# Patient Record
Sex: Female | Born: 1945 | Race: White | Hispanic: No | Marital: Married | Smoking: Former smoker
Health system: Southern US, Community
[De-identification: ages and names within clinical notes are randomized; demographics above are authoritative.]

## PROBLEM LIST (undated history)

## (undated) DIAGNOSIS — Q438 Other specified congenital malformations of intestine: Secondary | ICD-10-CM

## (undated) DIAGNOSIS — Z9109 Other allergy status, other than to drugs and biological substances: Secondary | ICD-10-CM

## (undated) DIAGNOSIS — E785 Hyperlipidemia, unspecified: Secondary | ICD-10-CM

## (undated) DIAGNOSIS — K759 Inflammatory liver disease, unspecified: Secondary | ICD-10-CM

## (undated) DIAGNOSIS — R030 Elevated blood-pressure reading, without diagnosis of hypertension: Secondary | ICD-10-CM

## (undated) DIAGNOSIS — F329 Major depressive disorder, single episode, unspecified: Secondary | ICD-10-CM

## (undated) DIAGNOSIS — F32A Depression, unspecified: Secondary | ICD-10-CM

## (undated) DIAGNOSIS — Z8619 Personal history of other infectious and parasitic diseases: Secondary | ICD-10-CM

## (undated) DIAGNOSIS — D649 Anemia, unspecified: Secondary | ICD-10-CM

## (undated) DIAGNOSIS — K5790 Diverticulosis of intestine, part unspecified, without perforation or abscess without bleeding: Secondary | ICD-10-CM

## (undated) DIAGNOSIS — K2270 Barrett's esophagus without dysplasia: Secondary | ICD-10-CM

## (undated) HISTORY — DX: Elevated blood-pressure reading, without diagnosis of hypertension: R03.0

## (undated) HISTORY — PX: VESICOVAGINAL FISTULA CLOSURE W/ TAH: SUR271

## (undated) HISTORY — DX: Barrett's esophagus without dysplasia: K22.70

## (undated) HISTORY — PX: TONSILLECTOMY: SUR1361

## (undated) HISTORY — DX: Other allergy status, other than to drugs and biological substances: Z91.09

## (undated) HISTORY — DX: Hyperlipidemia, unspecified: E78.5

## (undated) HISTORY — DX: Anemia, unspecified: D64.9

## (undated) HISTORY — PX: DILATION AND CURETTAGE OF UTERUS: SHX78

---

## 1999-11-16 HISTORY — PX: ABDOMINAL HYSTERECTOMY: SHX81

## 2004-11-15 HISTORY — PX: FOOT SURGERY: SHX648

## 2005-05-04 ENCOUNTER — Ambulatory Visit: Payer: Self-pay | Admitting: Internal Medicine

## 2005-10-06 ENCOUNTER — Ambulatory Visit: Payer: Self-pay | Admitting: Podiatry

## 2006-05-06 ENCOUNTER — Ambulatory Visit: Payer: Self-pay | Admitting: Internal Medicine

## 2006-07-01 ENCOUNTER — Ambulatory Visit: Payer: Self-pay | Admitting: Gastroenterology

## 2007-05-09 ENCOUNTER — Ambulatory Visit: Payer: Self-pay | Admitting: Internal Medicine

## 2007-08-14 ENCOUNTER — Ambulatory Visit: Payer: Self-pay | Admitting: Gastroenterology

## 2007-11-16 HISTORY — PX: CARPAL TUNNEL RELEASE: SHX101

## 2008-08-06 ENCOUNTER — Ambulatory Visit: Payer: Self-pay | Admitting: Internal Medicine

## 2009-09-09 ENCOUNTER — Ambulatory Visit: Payer: Self-pay | Admitting: Internal Medicine

## 2009-09-22 ENCOUNTER — Ambulatory Visit: Payer: Self-pay | Admitting: Gastroenterology

## 2009-10-21 ENCOUNTER — Ambulatory Visit: Payer: Self-pay | Admitting: Internal Medicine

## 2009-11-12 ENCOUNTER — Ambulatory Visit: Payer: Self-pay | Admitting: Internal Medicine

## 2010-09-14 ENCOUNTER — Ambulatory Visit: Payer: Self-pay | Admitting: Internal Medicine

## 2011-07-09 IMAGING — US US RENAL KIDNEY
1 series · 17 of 25 positions shown · non-contrast
Comparison: none

REASON FOR EXAM: Flank Pain
COMMENTS:

[Series 1: us renal kidney · 17 of 25 slices shown]
[im 1/25]
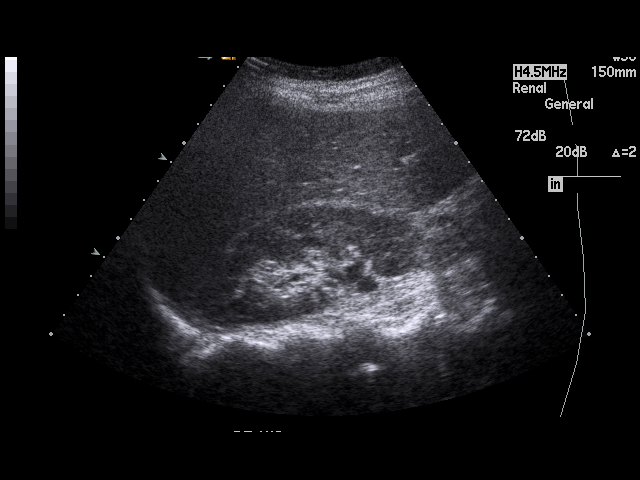
[im 3/25]
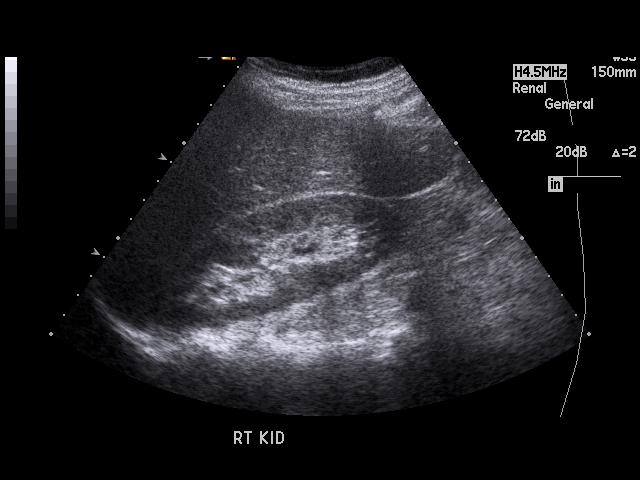
[im 4/25]
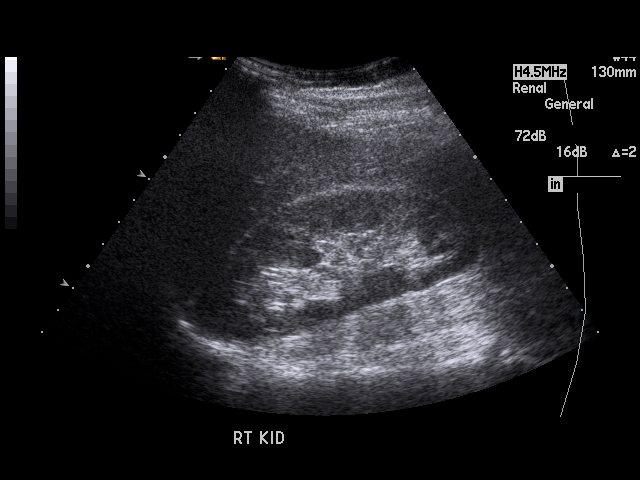
[im 6/25]
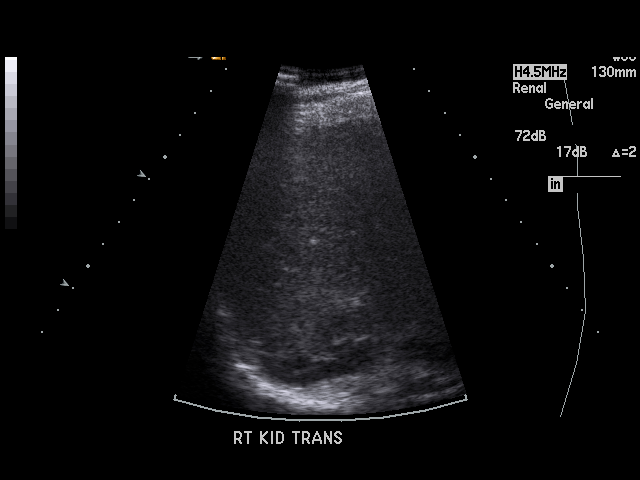
[im 7/25]
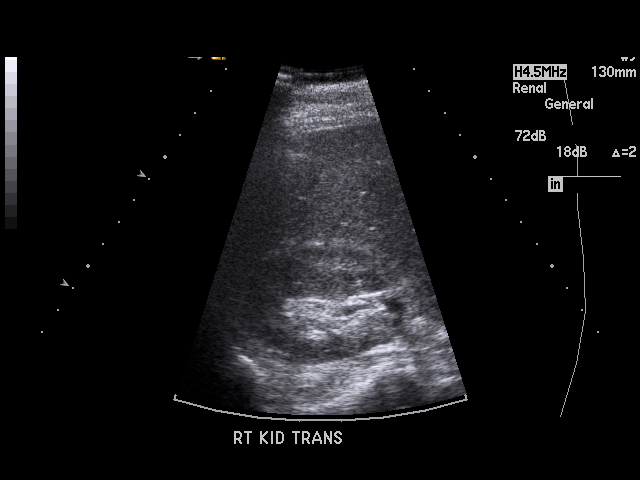
[im 9/25]
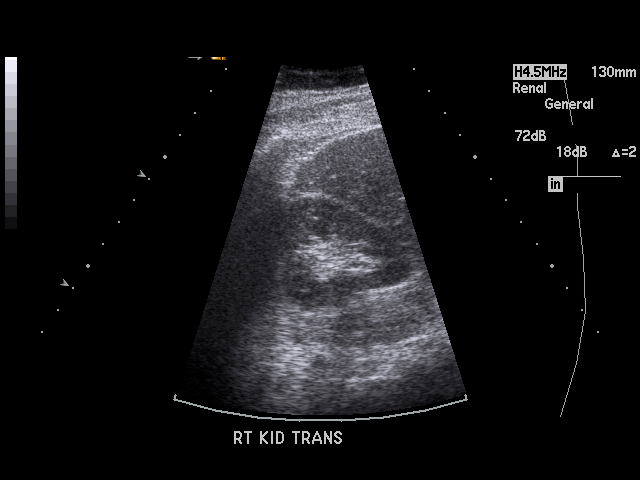
[im 10/25]
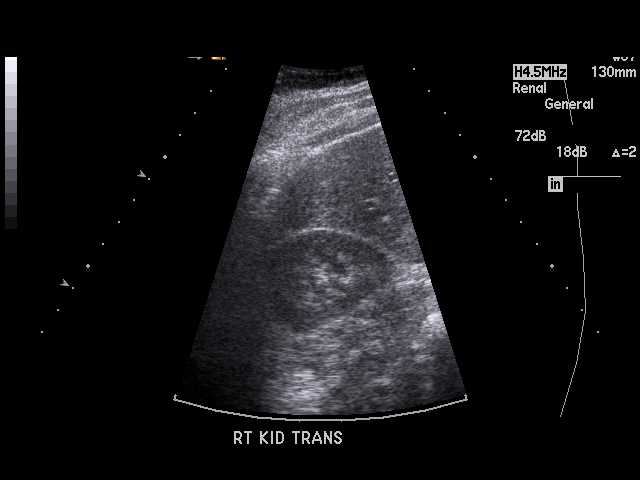
[im 12/25]
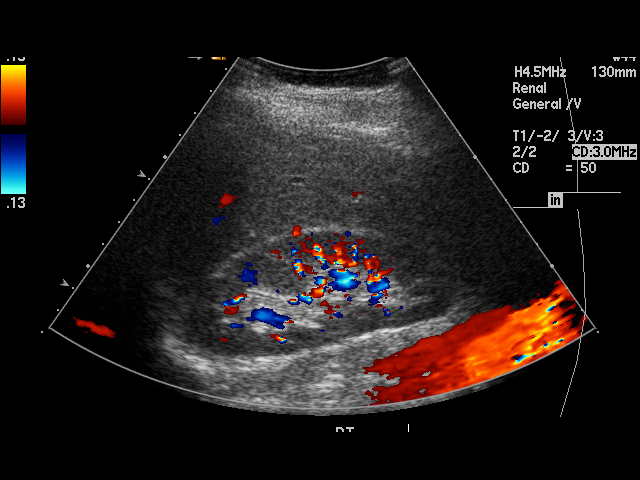
[im 13/25]
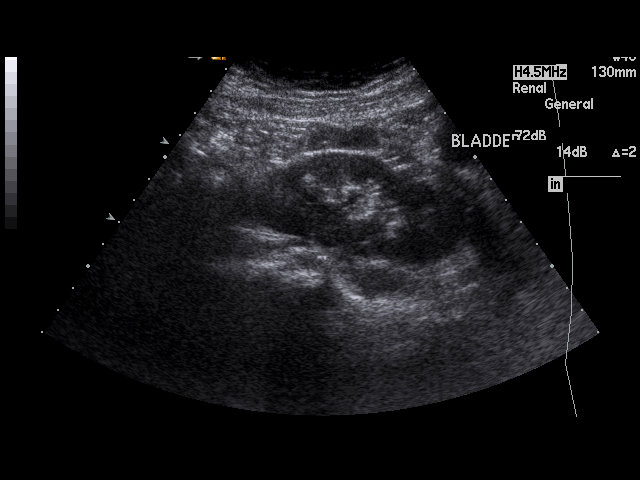
[im 14/25]
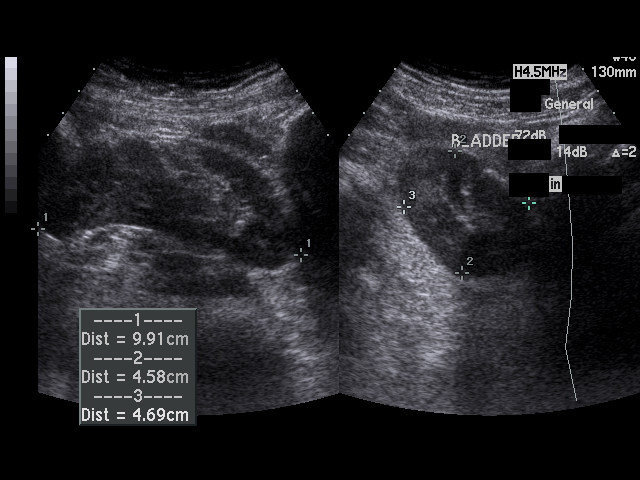
[im 16/25]
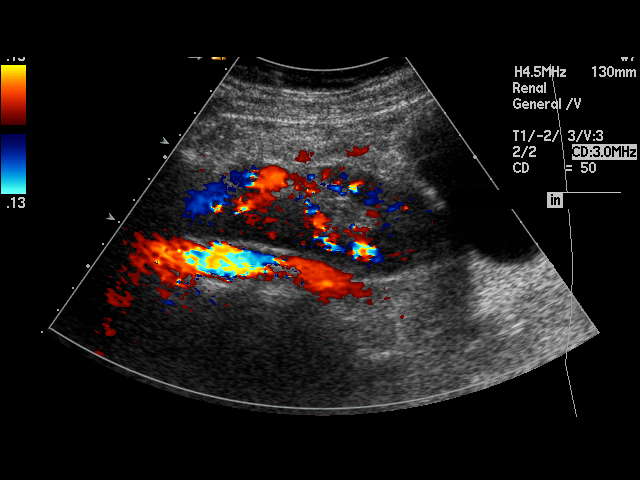
[im 17/25]
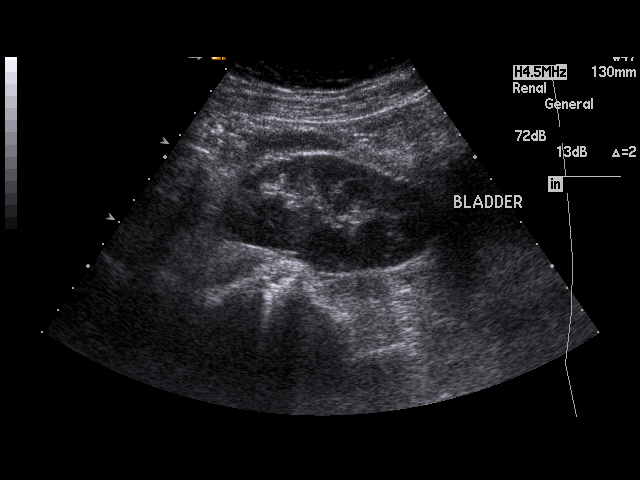
[im 19/25]
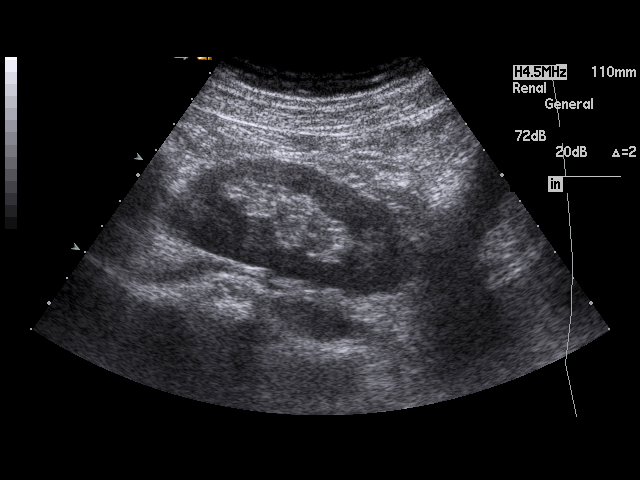
[im 20/25]
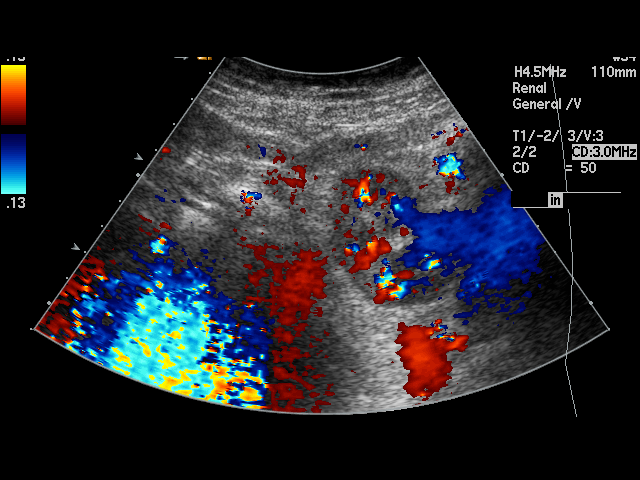
[im 22/25]
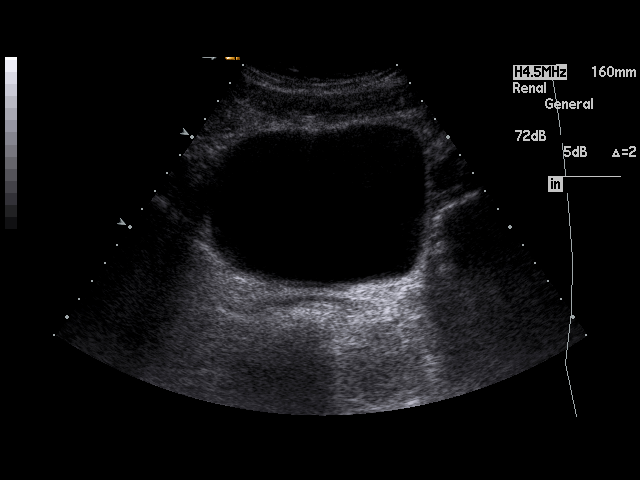
[im 23/25]
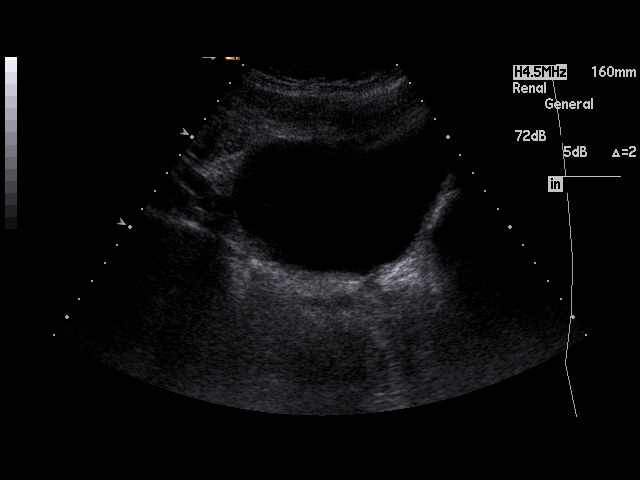
[im 25/25]
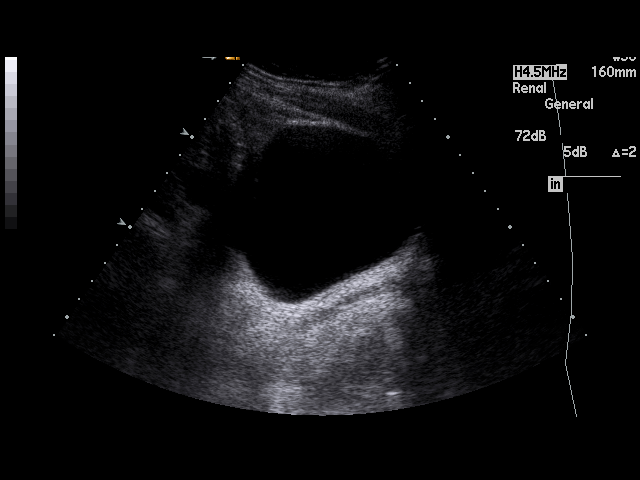

[17 of 25 positions shown; findings below may reference images not displayed]

PROCEDURE:     US  - US KIDNEY  - October 21, 2009  [DATE]

RESULT:     The left kidney is positioned inferiorly in the pelvis. It
measures approximately 9.9 x 4.5 x 4.6 cm. Its echotexture is normal. There
is no evidence of obstruction. The right kidney is normally positioned in
the abdomen and measures 11.3 x 5.4 by 4.4 cm. Survey views of the urinary
bladder are normal.
IMPRESSION: There is a pelvic kidney on the left. Both kidneys exhibit
normal echotexture with no evidence of obstruction.

## 2011-10-14 ENCOUNTER — Ambulatory Visit: Payer: Self-pay | Admitting: Internal Medicine

## 2012-02-21 ENCOUNTER — Ambulatory Visit: Payer: Self-pay | Admitting: Gastroenterology

## 2012-02-24 LAB — PATHOLOGY REPORT

## 2012-11-14 ENCOUNTER — Ambulatory Visit: Payer: Self-pay | Admitting: Internal Medicine

## 2013-11-19 ENCOUNTER — Ambulatory Visit: Payer: Self-pay | Admitting: Internal Medicine

## 2014-04-10 DIAGNOSIS — K2270 Barrett's esophagus without dysplasia: Secondary | ICD-10-CM

## 2014-04-10 DIAGNOSIS — Z9109 Other allergy status, other than to drugs and biological substances: Secondary | ICD-10-CM

## 2014-04-10 DIAGNOSIS — D649 Anemia, unspecified: Secondary | ICD-10-CM | POA: Insufficient documentation

## 2014-04-10 DIAGNOSIS — R030 Elevated blood-pressure reading, without diagnosis of hypertension: Secondary | ICD-10-CM

## 2014-04-10 DIAGNOSIS — E785 Hyperlipidemia, unspecified: Secondary | ICD-10-CM

## 2014-04-10 HISTORY — DX: Other allergy status, other than to drugs and biological substances: Z91.09

## 2014-04-10 HISTORY — DX: Hyperlipidemia, unspecified: E78.5

## 2014-04-10 HISTORY — DX: Elevated blood-pressure reading, without diagnosis of hypertension: R03.0

## 2014-04-10 HISTORY — DX: Anemia, unspecified: D64.9

## 2014-04-10 HISTORY — DX: Barrett's esophagus without dysplasia: K22.70

## 2014-12-03 ENCOUNTER — Ambulatory Visit: Payer: Self-pay | Admitting: Internal Medicine

## 2015-12-09 ENCOUNTER — Other Ambulatory Visit: Payer: Self-pay | Admitting: Internal Medicine

## 2015-12-09 DIAGNOSIS — Z1231 Encounter for screening mammogram for malignant neoplasm of breast: Secondary | ICD-10-CM

## 2015-12-18 ENCOUNTER — Ambulatory Visit
Admission: RE | Admit: 2015-12-18 | Discharge: 2015-12-18 | Disposition: A | Discharge status: Home / Self Care | Payer: Medicare Other | Source: Ambulatory Visit | Attending: Internal Medicine | Admitting: Internal Medicine

## 2015-12-18 ENCOUNTER — Other Ambulatory Visit: Payer: Self-pay | Admitting: Internal Medicine

## 2015-12-18 DIAGNOSIS — Z1231 Encounter for screening mammogram for malignant neoplasm of breast: Secondary | ICD-10-CM

## 2016-04-19 ENCOUNTER — Ambulatory Visit (INDEPENDENT_AMBULATORY_CARE_PROVIDER_SITE_OTHER): Payer: Medicare Other | Admitting: Urology

## 2016-04-19 ENCOUNTER — Encounter: Payer: Self-pay | Admitting: Urology

## 2016-04-19 VITALS — BP 150/91 | HR 82 | Ht 64.0 in | Wt 144.0 lb

## 2016-04-19 DIAGNOSIS — N393 Stress incontinence (female) (male): Secondary | ICD-10-CM

## 2016-04-19 DIAGNOSIS — N362 Urethral caruncle: Secondary | ICD-10-CM

## 2016-04-19 DIAGNOSIS — N3941 Urge incontinence: Secondary | ICD-10-CM

## 2016-04-19 DIAGNOSIS — B019 Varicella without complication: Secondary | ICD-10-CM | POA: Insufficient documentation

## 2016-04-19 DIAGNOSIS — N952 Postmenopausal atrophic vaginitis: Secondary | ICD-10-CM | POA: Diagnosis not present

## 2016-04-19 DIAGNOSIS — N819 Female genital prolapse, unspecified: Secondary | ICD-10-CM

## 2016-04-19 LAB — BLADDER SCAN AMB NON-IMAGING

## 2016-04-19 MED ORDER — ESTROGENS, CONJUGATED 0.625 MG/GM VA CREA: 1.0000 | TOPICAL_CREAM | Freq: Every day | VAGINAL | Status: DC

## 2016-04-19 MED ORDER — MIRABEGRON ER 50 MG PO TB24: 50.0000 mg | ORAL_TABLET | Freq: Every day | ORAL | Status: DC

## 2016-04-19 NOTE — Progress Notes (Signed)
04/19/2016 8:45 PM   Brittany Pittman 03-Nov-1946 [id number]  Referring provider: No referring provider defined for this encounter.  Chief Complaint  Patient presents with  . Urinary Incontinence    New Patient    HPI: 70 yo F with severe urinary symptoms, primarily urge incontinence.  She reports worsening in multiple episodes of urge incontinence each day which has progressed over the past year. She currently wears wears 4 ppd which can be saturated at times.  She reports when she has an urge to urinate, she needs to get to the toilet immediately. She always knows were all the bathrooms are. If she is out or unable to get to the bathroom on time, she'll have an episode where she is a large volume episode of incontinence.  She does also have urinary frequency.  She gets up 2x nightly to void.       She did take ditropan 15 mg XL for almost a year, stopped taking one month ago but did not help.   He did note having dry mouth from this medication.  She does leak when she laughs, coughs, or sneezing, only small amount.  She has done Kegels in the past but this does not seem to help.    No issues with UTI/ dysuria.  She dos have some mild dysparunia due to vaginal dryness, has used vaginal estrogen cream in the remote past to help with vaginal dryness but has not used in many years.  No vaginal pressure or bulging.  She drinks 2 cups of coffee daily and water.  S/p abdominal hysterectomy for abnormal uterine bleeding in 2001, Dr. Enzo Bi.  She had two normal vaginal deliveries.    She does have an incidental left pelvic kidney.  No recent UTIs.     She does have some trouble evaculating her stool at times and struggles with constipation.   PMH: Past Medical History  Diagnosis Date  . 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  . HLD (hyperlipidemia) 04/10/2014  . Blood pressure elevated without history of HTN 04/10/2014  . Allergy to environmental factors 04/10/2014  . Absolute anemia  04/10/2014  . Pelvic kidney     left    Surgical History: Past Surgical History  Procedure Laterality Date  . Carpal tunnel release Right 2009  . Abdominal hysterectomy  2001  . Foot surgery  2006  . Dilation and curettage of uterus    . Tonsillectomy      Home Medications:    Medication List       This list is accurate as of: 04/19/16  8:45 PM.  Always use your most recent med list.               atorvastatin 40 MG tablet  Commonly known as:  LIPITOR  TAKE 1 TABLET (40 MG TOTAL) BY MOUTH ONCE DAILY.     conjugated estrogens vaginal cream  Commonly known as:  PREMARIN  Place 1 Applicatorful vaginally daily. Pea sized amount per urethra M/ W/ Fr qhs     mirabegron ER 50 MG Tb24 tablet  Commonly known as:  MYRBETRIQ  Take 1 tablet (50 mg total) by mouth daily.     pantoprazole 40 MG tablet  Commonly known as:  PROTONIX  TAKE 1 TABLET (40 MG TOTAL) BY MOUTH 2 (TWO) TIMES DAILY.        Allergies: No Known Allergies  Family History: Family History  Problem Relation Age of Onset  . Breast cancer Sister 64  . Bladder  Cancer Neg Hx   . Prostate cancer Neg Hx   . Kidney cancer Neg Hx     Social History:  reports that she has quit smoking. She does not have any smokeless tobacco history on file. She reports that she drinks alcohol. She reports that she does not use illicit drugs.  ROS: UROLOGY Frequent Urination?: Yes Hard to postpone urination?: Yes Burning/pain with urination?: No Get up at night to urinate?: Yes Leakage of urine?: Yes Urine stream starts and stops?: No Trouble starting stream?: No Do you have to strain to urinate?: No Blood in urine?: No Urinary tract infection?: No Sexually transmitted disease?: No Injury to kidneys or bladder?: No Painful intercourse?: Yes Weak stream?: No Currently pregnant?: No Vaginal bleeding?: No Last menstrual period?: hysterectomy  Gastrointestinal Nausea?: No Vomiting?: No Indigestion/heartburn?:  Yes Diarrhea?: No Constipation?: No  Constitutional Fever: No Night sweats?: No Weight loss?: No Fatigue?: No  Skin Skin rash/lesions?: No Itching?: No  Eyes Blurred vision?: No Double vision?: No  Ears/Nose/Throat Sore throat?: No Sinus problems?: No  Hematologic/Lymphatic Swollen glands?: No Easy bruising?: No  Cardiovascular Leg swelling?: No Chest pain?: No  Respiratory Cough?: No Shortness of breath?: No  Endocrine Excessive thirst?: No  Musculoskeletal Back pain?: No Joint pain?: No  Neurological Headaches?: No Dizziness?: No  Psychologic Depression?: No Anxiety?: No  Physical Exam: BP 150/91 mmHg  Pulse 82  Ht 5\' 4"  (1.626 m)  Wt 144 lb (65.318 kg)  BMI 24.71 kg/m2  Constitutional:  Alert and oriented, No acute distress. HEENT: [place] AT, moist mucus membranes.  Trachea midline, no masses. Cardiovascular: No clubbing, cyanosis, or edema. Respiratory: Normal respiratory effort, no increased work of breathing. GI: Abdomen is soft, nontender, nondistended, no abdominal masses GU: No CVA tenderness.  Pelvic: Normal external genitalia. Urethral meatus with significant hypermobility with Valsalva, no demonstrable stress urinary incontinence. Small urethral caruncle at the 6 clock position noted. Atrophic vaginitis present. Speculum exam revealed stage I cystocele and stage II rectocele with Valsalva. There is also some apical descent. Skin: No rashes, bruises or suspicious lesions. Lymph: No cervical or inguinal adenopathy. Neurologic: Grossly intact, no focal deficits, moving all 4 extremities. Psychiatric: Normal mood and affect.  Laboratory Data: Urinalysis w/Microscopic (03/23/2016 8:21 AM) Urinalysis w/Microscopic (03/23/2016 8:21 AM)  Component Value Ref Range  Color Yellow Yellow, Straw  Clarity Clear Clear  Specific Gravity 1.010 1.000 - 1.030  pH, Urine 6.0 5.0 - 8.0  Protein, Urinalysis Negative Negative, Trace mg/dL  Glucose,  Urinalysis Negative Negative mg/dL  Ketones, Urinalysis Negative Negative mg/dL  Blood, Urinalysis Trace (A) Negative  Nitrite, Urinalysis Negative Negative  Leukocyte Esterase, Urinalysis Negative Negative  White Blood Cells, Urinalysis 0-3 None Seen, 0-3 /hpf  Red Blood Cells, Urinalysis 0-3 None Seen, 0-3 /hpf  Bacteria, Urinalysis Rare (A) None Seen /hpf  Squamous Epithelial Cells, Urinalysis Few Rare, Few, None Seen /hpf   CBC w/auto Differential (5 Part) (03/23/2016 8:21 AM) CBC w/auto Differential (5 Part) (03/23/2016 8:21 AM)  Component Value Ref Range  WBC (White Blood Cell Count) 6.9 4.1 - 10.2 10^3/uL  RBC (Red Blood Cell Count) 4.09 4.04 - 5.48 10^6/uL  Hemoglobin 12.8 12.0 - 15.0 gm/dL  Hematocrit 39.1 35.0 - 47.0 %  MCV (Mean Corpuscular Volume) 95.6 80.0 - 100.0 fl  MCH (Mean Corpuscular Hemoglobin) 31.3 (H) 27.0 - 31.2 pg  MCHC (Mean Corpuscular Hemoglobin Concentration) 32.7 32.0 - 36.0 gm/dL  Platelet Count 298 150 - 450 10^3/uL   Comprehensive Metabolic Panel (CMP) (  03/23/2016 8:21 AM) Comprehensive Metabolic Panel (CMP) (A999333 8:21 AM)  Component Value Ref Range  Glucose 89 70 - 110 mg/dL  Sodium 140 136 - 145 mmol/L  Potassium 3.9 3.6 - 5.1 mmol/L  Chloride 102 97 - 109 mmol/L  Carbon Dioxide (CO2) 30.1 22.0 - 32.0 mmol/L  Urea Nitrogen (BUN) 18 7 - 25 mg/dL  Creatinine 0.7 0.6 - 1.1 mg/dL     PVR:  Results for orders placed or performed in visit on 04/19/16  BLADDER SCAN AMB NON-IMAGING  Result Value Ref Range   Scan Result 65ml      Pertinent Imaging: n/a  Assessment & Plan:    1. Urge incontinence We discussed behavioral modification today at length. I have urged her to cut back or ideally stop drinking coffee.  We discussed pharmacotherapy for severe OAB.  Samples of Mybetriq 50 mg given today 2 weeks. We discussed the side effects of this medication. Prescription was sent to pharmacy. She was urged to call if this is not effective for  medication change.  Plan for return in 6 weeks for BP check/ PVR and review of symptoms.  - Urinalysis, Complete - BLADDER SCAN AMB NON-IMAGING  2. Stress incontinence, female Kegel exercises were reviewed.  3. 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 Other then some bowel symptoms, she is otherwise fairly asymptomatic. No intervention recommended at this time.  4. Atrophic vaginitis Recommend resuming topical estogen cream. This will help with her vaginal tissues, vaginal dryness, and dyspareunia.  5. Urethral caruncle Asymptomatic, incidental.  Estrogen cream as above.   Return in about 6 weeks (around 05/31/2016) for PVR, recheck symptoms.  Hollice Espy, MD  [facility] [address] [place], [place] [address] [phone]

## 2016-04-20 LAB — MICROSCOPIC EXAMINATION
BACTERIA UA: NONE SEEN
WBC, UA: NONE SEEN /hpf (ref 0–?)

## 2016-04-20 LAB — URINALYSIS, COMPLETE
BILIRUBIN UA: NEGATIVE
Glucose, UA: NEGATIVE
KETONES UA: NEGATIVE
LEUKOCYTES UA: NEGATIVE
Nitrite, UA: NEGATIVE
PROTEIN UA: NEGATIVE
SPEC GRAV UA: 1.015 (ref 1.005–1.030)
Urobilinogen, Ur: 0.2 mg/dL (ref 0.2–1.0)
pH, UA: 7 (ref 5.0–7.5)

## 2016-04-28 ENCOUNTER — Ambulatory Visit: Payer: Self-pay | Admitting: Urology

## 2016-05-28 ENCOUNTER — Ambulatory Visit: Payer: Self-pay | Admitting: Urology

## 2016-06-04 ENCOUNTER — Ambulatory Visit (INDEPENDENT_AMBULATORY_CARE_PROVIDER_SITE_OTHER): Payer: Medicare Other | Admitting: Urology

## 2016-06-04 ENCOUNTER — Encounter: Payer: Self-pay | Admitting: Urology

## 2016-06-04 VITALS — BP 152/87 | HR 72 | Ht 64.0 in | Wt 142.5 lb

## 2016-06-04 DIAGNOSIS — N3941 Urge incontinence: Secondary | ICD-10-CM | POA: Diagnosis not present

## 2016-06-04 DIAGNOSIS — N819 Female genital prolapse, unspecified: Secondary | ICD-10-CM

## 2016-06-04 DIAGNOSIS — N393 Stress incontinence (female) (male): Secondary | ICD-10-CM | POA: Diagnosis not present

## 2016-06-04 DIAGNOSIS — I10 Essential (primary) hypertension: Secondary | ICD-10-CM

## 2016-06-04 DIAGNOSIS — N952 Postmenopausal atrophic vaginitis: Secondary | ICD-10-CM

## 2016-06-04 LAB — BLADDER SCAN AMB NON-IMAGING: Scan Result: 57

## 2016-06-04 NOTE — Progress Notes (Signed)
1:57 PM  06/05/2016   Brittany Pittman Sep 03, 1946 [id number]  Referring provider: Idelle Crouch, MD [facility] [place], [place] [address]  Chief Complaint  Patient presents with  . Follow-up    urgency    HPI: 70 yo F With severe urge incontinence and mild stress incontinence who returns today 6 weeks after starting Mybetriq 50 mg daily and doing pelvic floor exercises.  She is very pleased with her overall improvement in symptoms. Her severe urgency and urge incontinence is almost completely resolved. In addition, she is doing Kegel exercises on a routine basis and has significantly less leakage with activity.   She did take ditropan 15 mg XL for almost a year, stopped taking one month ago but did not help.   He did note having dry mouth from this medication.  She does have an incidental left pelvic kidney.  No recent UTIs.     Hypertension noted again today, patient reports that this is fairly new for her. It does run in her family.  PVR today minimal.   PMH: Past Medical History  Diagnosis Date  . 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  . HLD (hyperlipidemia) 04/10/2014  . Blood pressure elevated without history of HTN 04/10/2014  . Allergy to environmental factors 04/10/2014  . 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 Pelvic kidney     left    Surgical History: Past Surgical History  Procedure Laterality Date  . Carpal tunnel release Right 2009  . Abdominal hysterectomy  2001  . Foot surgery  2006  . Dilation and curettage of uterus    . Tonsillectomy      Home Medications:    Medication List       This list is accurate as of: 06/04/16 11:59 PM.  Always use your most recent med list.               atorvastatin 40 MG tablet  Commonly known as:  LIPITOR  TAKE 1 TABLET (40 MG TOTAL) BY MOUTH ONCE DAILY.     citalopram 20 MG tablet  Commonly known as:  CELEXA  TAKE 1 TABLET (20 MG TOTAL) BY MOUTH ONCE DAILY.     conjugated estrogens  vaginal cream  Commonly known as:  PREMARIN  Place 1 Applicatorful vaginally daily. Pea sized amount per urethra M/ W/ Fr qhs     mirabegron ER 50 MG Tb24 tablet  Commonly known as:  MYRBETRIQ  Take 1 tablet (50 mg total) by mouth daily.     pantoprazole 40 MG tablet  Commonly known as:  PROTONIX  TAKE 1 TABLET (40 MG TOTAL) BY MOUTH 2 (TWO) TIMES DAILY.     polyethylene glycol powder powder  Commonly known as:  GLYCOLAX/MIRALAX  MIX 17 GRAMS (1 SCOOP) BY MOUTH ONCE A DAY OR AS DIRECTED FOR COLONIC PREP        Allergies: No Known Allergies  Family History: Family History  Problem Relation Age of Onset  . Breast cancer Sister 41  . Bladder Cancer Neg Hx   . Prostate cancer Neg Hx   . Kidney cancer Neg Hx     Social History:  reports that she has quit smoking. She does not have any smokeless tobacco history on file. She reports that she drinks alcohol. She reports that she does not use illicit drugs.  ROS: UROLOGY Frequent Urination?: Yes Hard to postpone urination?: Yes Burning/pain with urination?: No Get up at night to urinate?: Yes Leakage of urine?:  Yes Urine stream starts and stops?: No Trouble starting stream?: No Do you have to strain to urinate?: No Blood in urine?: No Urinary tract infection?: No Sexually transmitted disease?: No Injury to kidneys or bladder?: No Painful intercourse?: No Weak stream?: No Currently pregnant?: No Vaginal bleeding?: No Last menstrual period?: n  Gastrointestinal Nausea?: No Vomiting?: No Indigestion/heartburn?: No Diarrhea?: No Constipation?: No  Constitutional Fever: No Night sweats?: No Weight loss?: No Fatigue?: No  Skin Skin rash/lesions?: No Itching?: No  Eyes Blurred vision?: No Double vision?: No  Ears/Nose/Throat Sore throat?: No Sinus problems?: No  Hematologic/Lymphatic Swollen glands?: No Easy bruising?: No  Cardiovascular Leg swelling?: No Chest pain?: No  Respiratory Cough?:  No Shortness of breath?: No  Endocrine Excessive thirst?: No  Musculoskeletal Back pain?: No Joint pain?: No  Neurological Headaches?: No Dizziness?: No  Psychologic Depression?: No Anxiety?: No  Physical Exam: BP 152/87 mmHg  Pulse 72  Ht 5\' 4"  (1.626 m)  Wt 142 lb 8 oz (64.638 kg)  BMI 24.45 kg/m2  Constitutional:  Alert and oriented, No acute distress. HEENT: [place] AT, moist mucus membranes.  Trachea midline, no masses. Cardiovascular: No clubbing, cyanosis, or edema. Respiratory: Normal respiratory effort, no increased work of breathing. Skin: No rashes, bruises or suspicious lesions.. Neurologic: Grossly intact, no focal deficits, moving all 4 extremities. Psychiatric: Normal mood and affect.  Laboratory Data: Urinalysis w/Microscopic (03/23/2016 8:21 AM) Urinalysis w/Microscopic (03/23/2016 8:21 AM)  Component Value Ref Range  Color Yellow Yellow, Straw  Clarity Clear Clear  Specific Gravity 1.010 1.000 - 1.030  pH, Urine 6.0 5.0 - 8.0  Protein, Urinalysis Negative Negative, Trace mg/dL  Glucose, Urinalysis Negative Negative mg/dL  Ketones, Urinalysis Negative Negative mg/dL  Blood, Urinalysis Trace (A) Negative  Nitrite, Urinalysis Negative Negative  Leukocyte Esterase, Urinalysis Negative Negative  White Blood Cells, Urinalysis 0-3 None Seen, 0-3 /hpf  Red Blood Cells, Urinalysis 0-3 None Seen, 0-3 /hpf  Bacteria, Urinalysis Rare (A) None Seen /hpf  Squamous Epithelial Cells, Urinalysis Few Rare, Few, None Seen /hpf   CBC w/auto Differential (5 Part) (03/23/2016 8:21 AM) CBC w/auto Differential (5 Part) (03/23/2016 8:21 AM)  Component Value Ref Range  WBC (White Blood Cell Count) 6.9 4.1 - 10.2 10^3/uL  RBC (Red Blood Cell Count) 4.09 4.04 - 5.48 10^6/uL  Hemoglobin 12.8 12.0 - 15.0 gm/dL  Hematocrit 39.1 35.0 - 47.0 %  MCV (Mean Corpuscular Volume) 95.6 80.0 - 100.0 fl  MCH (Mean Corpuscular Hemoglobin) 31.3 (H) 27.0 - 31.2 pg  MCHC (Mean Corpuscular  Hemoglobin Concentration) 32.7 32.0 - 36.0 gm/dL  Platelet Count 298 150 - 450 10^3/uL   Comprehensive Metabolic Panel (CMP) (A999333 8:21 AM) Comprehensive Metabolic Panel (CMP) (A999333 8:21 AM)  Component Value Ref Range  Glucose 89 70 - 110 mg/dL  Sodium 140 136 - 145 mmol/L  Potassium 3.9 3.6 - 5.1 mmol/L  Chloride 102 97 - 109 mmol/L  Carbon Dioxide (CO2) 30.1 22.0 - 32.0 mmol/L  Urea Nitrogen (BUN) 18 7 - 25 mg/dL  Creatinine 0.7 0.6 - 1.1 mg/dL     PVR:  Results for orders placed or performed in visit on 06/04/16  BLADDER SCAN AMB NON-IMAGING  Result Value Ref Range   Scan Result 57      Pertinent Imaging: n/a  Assessment & Plan:    1. Urge incontinence Mybetriq 50 mg doing well, continue this medication Post void residual minimal without concern for incomplete bladder emptying. Blood pressure is elevated today but was prior to  starting Mybetriq last visit therefore I feel it is unrelated to starting this medication.  I have asked her to address this with her primary care physician. - BLADDER SCAN AMB NON-IMAGING  2. Stress incontinence, female Kegel exercises daily, seems to be helping  3. 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 Other then some bowel symptoms, she is otherwise fairly asymptomatic. No intervention recommended at this time.  Bowel hygiene reviewed again today in detail.  4. Atrophic vaginitis Continue Estrogen cream.    5. Hypertention As above- f/u with PCP  Return if symptoms worsen or fail to improve.  Hollice Espy, MD  [facility] [address] [place], [place] [address] [phone]

## 2016-07-07 ENCOUNTER — Encounter: Payer: Self-pay | Admitting: *Deleted

## 2016-07-08 ENCOUNTER — Ambulatory Visit
Admission: RE | Admit: 2016-07-08 | Discharge: 2016-07-08 | Disposition: A | Discharge status: Home / Self Care | Payer: Medicare Other | Source: Ambulatory Visit | Attending: Gastroenterology | Admitting: Gastroenterology

## 2016-07-08 ENCOUNTER — Ambulatory Visit: Payer: Medicare Other | Admitting: Certified Registered"

## 2016-07-08 ENCOUNTER — Encounter
Admission: RE | Disposition: A | Discharge status: Home / Self Care | Payer: Self-pay | Source: Ambulatory Visit | Attending: Gastroenterology

## 2016-07-08 ENCOUNTER — Encounter: Payer: Self-pay | Admitting: *Deleted

## 2016-07-08 DIAGNOSIS — F329 Major depressive disorder, single episode, unspecified: Secondary | ICD-10-CM | POA: Diagnosis not present

## 2016-07-08 DIAGNOSIS — Z87891 Personal history of nicotine dependence: Secondary | ICD-10-CM | POA: Insufficient documentation

## 2016-07-08 DIAGNOSIS — Z79899 Other long term (current) drug therapy: Secondary | ICD-10-CM | POA: Insufficient documentation

## 2016-07-08 DIAGNOSIS — K449 Diaphragmatic hernia without obstruction or gangrene: Secondary | ICD-10-CM | POA: Diagnosis not present

## 2016-07-08 DIAGNOSIS — K219 Gastro-esophageal reflux disease without esophagitis: Secondary | ICD-10-CM | POA: Diagnosis not present

## 2016-07-08 DIAGNOSIS — K2270 Barrett's esophagus without dysplasia: Secondary | ICD-10-CM | POA: Diagnosis not present

## 2016-07-08 DIAGNOSIS — D649 Anemia, unspecified: Secondary | ICD-10-CM | POA: Diagnosis not present

## 2016-07-08 DIAGNOSIS — E785 Hyperlipidemia, unspecified: Secondary | ICD-10-CM | POA: Insufficient documentation

## 2016-07-08 DIAGNOSIS — Z1211 Encounter for screening for malignant neoplasm of colon: Secondary | ICD-10-CM | POA: Diagnosis present

## 2016-07-08 DIAGNOSIS — I10 Essential (primary) hypertension: Secondary | ICD-10-CM | POA: Diagnosis not present

## 2016-07-08 DIAGNOSIS — K5730 Diverticulosis of large intestine without perforation or abscess without bleeding: Secondary | ICD-10-CM | POA: Insufficient documentation

## 2016-07-08 DIAGNOSIS — Q438 Other specified congenital malformations of intestine: Secondary | ICD-10-CM | POA: Insufficient documentation

## 2016-07-08 DIAGNOSIS — K2210 Ulcer of esophagus without bleeding: Secondary | ICD-10-CM | POA: Diagnosis not present

## 2016-07-08 HISTORY — DX: Major depressive disorder, single episode, unspecified: F32.9

## 2016-07-08 HISTORY — PX: ESOPHAGOGASTRODUODENOSCOPY (EGD) WITH PROPOFOL: SHX5813

## 2016-07-08 HISTORY — DX: Personal history of other infectious and parasitic diseases: Z86.19

## 2016-07-08 HISTORY — DX: Depression, unspecified: F32.A

## 2016-07-08 HISTORY — DX: Inflammatory liver disease, unspecified: K75.9

## 2016-07-08 HISTORY — PX: COLONOSCOPY WITH PROPOFOL: SHX5780

## 2016-07-08 SURGERY — ESOPHAGOGASTRODUODENOSCOPY (EGD) WITH PROPOFOL
Anesthesia: General

## 2016-07-08 MED ORDER — LIDOCAINE 2% (20 MG/ML) 5 ML SYRINGE
INTRAMUSCULAR | Status: DC | PRN
  Administered 2016-07-08: 50 mg via INTRAVENOUS

## 2016-07-08 MED ORDER — PROPOFOL 500 MG/50ML IV EMUL
INTRAVENOUS | Status: DC | PRN
  Administered 2016-07-08: 120 ug/kg/min via INTRAVENOUS

## 2016-07-08 MED ORDER — GLYCOPYRROLATE 0.2 MG/ML IJ SOLN
INTRAMUSCULAR | Status: DC | PRN
  Administered 2016-07-08: 0.2 mg via INTRAVENOUS

## 2016-07-08 MED ORDER — MIDAZOLAM HCL 5 MG/5ML IJ SOLN
INTRAMUSCULAR | Status: DC | PRN
  Administered 2016-07-08: 1 mg via INTRAVENOUS

## 2016-07-08 MED ORDER — SODIUM CHLORIDE 0.9 % IV SOLN: INTRAVENOUS | Status: DC

## 2016-07-08 MED ORDER — FENTANYL CITRATE (PF) 100 MCG/2ML IJ SOLN
INTRAMUSCULAR | Status: DC | PRN
  Administered 2016-07-08: 50 ug via INTRAVENOUS

## 2016-07-08 MED ORDER — SODIUM CHLORIDE 0.9 % IV SOLN
INTRAVENOUS | Status: DC
  Administered 2016-07-08: 08:00:00 via INTRAVENOUS

## 2016-07-08 MED ORDER — PROPOFOL 10 MG/ML IV BOLUS
INTRAVENOUS | Status: DC | PRN
  Administered 2016-07-08: 70 mg via INTRAVENOUS

## 2016-07-08 MED ORDER — PHENYLEPHRINE HCL 10 MG/ML IJ SOLN
INTRAMUSCULAR | Status: DC | PRN
Start: 2016-07-08 — End: 2016-07-08
  Administered 2016-07-08 (×4): 100 ug via INTRAVENOUS

## 2016-07-08 NOTE — Anesthesia Preprocedure Evaluation (Signed)
Anesthesia Evaluation  Patient identified by MRN, date of birth, ID band Patient awake    Reviewed: Allergy & Precautions, NPO status , Patient's Chart, lab work & pertinent test results  Airway Mallampati: II       Dental  (+) Chipped   Pulmonary neg pulmonary ROS, former smoker,    Pulmonary exam normal        Cardiovascular negative cardio ROS Normal cardiovascular exam     Neuro/Psych PSYCHIATRIC DISORDERS Depression negative neurological ROS     GI/Hepatic (+) Hepatitis -  Endo/Other  negative endocrine ROS  Renal/GU negative Renal ROS  negative genitourinary   Musculoskeletal negative musculoskeletal ROS (+)   Abdominal Normal abdominal exam  (+)   Peds negative pediatric ROS (+)  Hematology  (+) anemia ,   Anesthesia Other Findings   Reproductive/Obstetrics                             Anesthesia Physical Anesthesia Plan  ASA: II  Anesthesia Plan: General   Post-op Pain Management:    Induction: Intravenous  Airway Management Planned: Nasal Cannula  Additional Equipment:   Intra-op Plan:   Post-operative Plan:   Informed Consent: I have reviewed the patients History and Physical, chart, labs and discussed the procedure including the risks, benefits and alternatives for the proposed anesthesia with the patient or authorized representative who has indicated his/her understanding and acceptance.   Dental advisory given  Plan Discussed with: CRNA and Surgeon  Anesthesia Plan Comments:         Anesthesia Quick Evaluation

## 2016-07-08 NOTE — Op Note (Signed)
[facility]astroenterology Patient Name: Brittany Pittman Procedure Date: 07/08/2016 8:30 AM MRN: [id number] Account #: [id number] Date of Birth: November 04, 1946 Admit Type: Outpatient Age: 70 Room: [facility] ENDO ROOM 1 Gender: Female Note Status: Finalized Procedure:            Upper GI endoscopy Indications:          Follow-up of Barrett's esophagus Providers:            Lollie Sails, MD Referring MD:         Leonie Douglas. Doy Hutching, MD (Referring MD) Medicines:            Monitored Anesthesia Care Complications:        No immediate complications. Procedure:            Pre-Anesthesia Assessment:                       - ASA Grade Assessment: II - A patient with mild                        systemic disease.                       After obtaining informed consent, the endoscope was                        passed under direct vision. Throughout the procedure,                        the patient's blood pressure, pulse, and oxygen                        saturations were monitored continuously. The Endoscope                        was introduced through the mouth, and advanced to the                        third part of duodenum. The upper GI endoscopy was                        accomplished without difficulty. The patient tolerated                        the procedure well. Findings:      There were esophageal mucosal changes secondary to established       long-segment Barrett's disease present in the lower third of the       esophagus. The maximum longitudinal extent of these mucosal changes was       5 cm in length. Mucosa was biopsied with a cold forceps for histology in       4 quadrants at intervals of 2 cm at 33, 34, 36 and 38 cm from the       incisors.      A small hiatal hernia was present. Insufflation was somewhat difficult       to maintain due to eructation.      Patchy mild inflammation characterized by erosions was found in the       gastric body and in the  gastric antrum. Biopsies were taken with a cold       forceps for histology. Biopsies were  taken with a cold forceps for       Helicobacter pylori testing.      The cardia and gastric fundus were normal on retroflexion otherwise.      The examined duodenum was normal. Impression:           - Esophageal mucosal changes secondary to established                        long-segment Barrett's disease. Biopsied.                       - Small hiatal hernia.                       - Erosive gastritis. Biopsied.                       - Normal examined duodenum. Recommendation:       - Telephone GI clinic for pathology results in 1 week.                       - Await pathology results.                       - Continue present medications.                       - Perform a colonoscopy today. Procedure Code(s):    --- Professional ---                       [phone], Esophagogastroduodenoscopy, flexible, transoral;                        with biopsy, single or multiple Diagnosis Code(s):    --- Professional ---                       K22.70, Barrett's esophagus without dysplasia                       K44.9, Diaphragmatic hernia without obstruction or                        gangrene                       K29.60, Other gastritis without bleeding CPT copyright 2016 [employer] Medical Association. All rights reserved. The codes documented in this report are preliminary and upon coder review may  be revised to meet current compliance requirements. Lollie Sails, MD 07/08/2016 9:08:56 AM This report has been signed electronically. Number of Addenda: 0 Note Initiated On: 07/08/2016 8:30 AM      [facility]

## 2016-07-08 NOTE — Anesthesia Postprocedure Evaluation (Signed)
Anesthesia Post Note  Patient: Desree Afzal Celestin  Procedure(s) Performed: Procedure(s) (LRB): ESOPHAGOGASTRODUODENOSCOPY (EGD) WITH PROPOFOL (N/A) COLONOSCOPY WITH PROPOFOL (N/A)  Patient location during evaluation: PACU Anesthesia Type: General Level of consciousness: awake and alert and oriented Pain management: pain level controlled Vital Signs Assessment: post-procedure vital signs reviewed and stable Respiratory status: spontaneous breathing Cardiovascular status: blood pressure returned to baseline Anesthetic complications: no    Last Vitals:  Vitals:   07/08/16 1020 07/08/16 1030  BP: 114/66 117/80  Pulse: (!) 58   Resp: 15   Temp:      Last Pain:  Vitals:   07/08/16 1001  TempSrc: Tympanic                 Tanisa Lagace

## 2016-07-08 NOTE — Op Note (Signed)
[facility] Gastroenterology Patient Name: Brittany Pittman Procedure Date: 07/08/2016 8:29 AM MRN: [id number] Account #: [id number] Date of Birth: 05/18/1946 Admit Type: Outpatient Age: 70 Room: [facility] ENDO ROOM 1 Gender: Female Note Status: Finalized Procedure:            Colonoscopy Indications:          Screening for colorectal malignant neoplasm Providers:            Lollie Sails, MD Referring MD:         Leonie Douglas. Doy Hutching, MD (Referring MD) Medicines:            Monitored Anesthesia Care Complications:        No immediate complications. Procedure:            Pre-Anesthesia Assessment:                       - ASA Grade Assessment: II - A patient with mild                        systemic disease.                       After obtaining informed consent, the colonoscope was                        passed under direct vision. Throughout the procedure,                        the patient's blood pressure, pulse, and oxygen                        saturations were monitored continuously. The                        Colonoscope was introduced through the anus and                        advanced to the the cecum, identified by appendiceal                        orifice and ileocecal valve. The colonoscopy was                        extremely difficult due to poor endoscopic                        visualization and a tortuous colon. Successful                        completion of the procedure was aided by changing the                        patient to a supine position, changing the patient to a                        prone position, using manual pressure and lavage. The                        patient tolerated the procedure well. The quality of  the bowel preparation was fair. Findings:      The transverse colon, hepatic flexure and ascending colon were       significantly redundant.      Multiple medium-mouthed diverticula were found in the  sigmoid colon and       descending colon.      The digital rectal exam was normal. Impression:           - Preparation of the colon was fair.                       - Redundant colon.                       - Diverticulosis in the sigmoid colon and in the                        descending colon.                       - No specimens collected. Recommendation:       - Discharge patient to home.                       - Repeat colonoscopy in 10 years for screening purposes. Procedure Code(s):    --- Professional ---                       [phone], Colonoscopy, flexible; diagnostic, including                        collection of specimen(s) by brushing or washing, when                        performed (separate procedure) Diagnosis Code(s):    --- Professional ---                       Z12.11, Encounter for screening for malignant neoplasm                        of colon                       K57.30, Diverticulosis of large intestine without                        perforation or abscess without bleeding                       Q43.8, Other specified congenital malformations of                        intestine CPT copyright 2016 American Medical Association. All rights reserved. The codes documented in this report are preliminary and upon coder review may  be revised to meet current compliance requirements. Lollie Sails, MD 07/08/2016 10:04:09 AM This report has been signed electronically. Number of Addenda: 0 Note Initiated On: 07/08/2016 8:29 AM Scope Withdrawal Time: 0 hours 7 minutes 44 seconds  Total Procedure Duration: 0 hours 45 minutes 15 seconds       [facility]

## 2016-07-08 NOTE — Transfer of Care (Signed)
Immediate Anesthesia Transfer of Care Note  Patient: Brittany Pittman  Procedure(s) Performed: Procedure(s): ESOPHAGOGASTRODUODENOSCOPY (EGD) WITH PROPOFOL (N/A) COLONOSCOPY WITH PROPOFOL (N/A)  Patient Location: Endoscopy Unit  Anesthesia Type:General  Level of Consciousness: awake and alert   Airway & Oxygen Therapy: Patient Spontanous Breathing and Patient connected to nasal cannula oxygen  Post-op Assessment: Report given to RN and Post -op Vital signs reviewed and stable  Post vital signs: Reviewed  Last Vitals:  Vitals:   07/08/16 0738 07/08/16 1001  BP: 132/78 115/69  Pulse: 70 78  Resp: 16 16  Temp: 36.4 C (!) 35.8 C    Last Pain:  Vitals:   07/08/16 0738  TempSrc: Tympanic         Complications: No apparent anesthesia complications

## 2016-07-08 NOTE — H&P (Signed)
Outpatient short stay form Pre-procedure 07/08/2016 8:30 AM Brittany Sails MD  Primary Physician: Dr. Fulton Reek  Reason for visit:  EGD and colonoscopy  History of present illness:  Brittany Pittman is a 70 year old female presenting today as above. She has personal history of Barrett's esophagus. Her last EGD was about 2013. There was no evidence of dysplasia or malignancy. Her last colonoscopy was about 10 years ago showing diverticulosis.  She tolerated her prep well. She takes no aspirin products or blood thinning agents    Current Facility-Administered Medications:  .  0.9 %  sodium chloride infusion, , Intravenous, Continuous, Brittany Sails, MD, Last Rate: 20 mL/hr at 07/08/16 0751 .  0.9 %  sodium chloride infusion, , Intravenous, Continuous, Brittany Sails, MD  Prescriptions Prior to Admission  Medication Sig Dispense Refill Last Dose  . atorvastatin (LIPITOR) 40 MG tablet TAKE 1 TABLET (40 MG TOTAL) BY MOUTH ONCE DAILY.  8 Past Week at Unknown time  . citalopram (CELEXA) 20 MG tablet TAKE 1 TABLET (20 MG TOTAL) BY MOUTH ONCE DAILY.  10 07/07/2016 at Unknown time  . mirabegron ER (MYRBETRIQ) 50 MG TB24 tablet Take 1 tablet (50 mg total) by mouth daily. 30 tablet 3 07/07/2016 at Unknown time  . pantoprazole (PROTONIX) 40 MG tablet TAKE 1 TABLET (40 MG TOTAL) BY MOUTH 2 (TWO) TIMES DAILY.  11 07/07/2016 at Unknown time  . conjugated estrogens (PREMARIN) vaginal cream Place 1 Applicatorful vaginally daily. Pea sized amount per urethra M/ W/ Fr qhs 42.5 g 12 Taking  . polyethylene glycol powder (GLYCOLAX/MIRALAX) powder MIX 17 GRAMS (1 SCOOP) BY MOUTH ONCE A DAY OR AS DIRECTED FOR COLONIC PREP  0 Taking     No Known Allergies   Past Medical History:  Diagnosis Date  . 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 Allergy to environmental factors 04/10/2014  . 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  . Blood pressure elevated without history of HTN 04/10/2014  . Depression   . Hepatitis   . History of  chicken pox   . HLD (hyperlipidemia) 04/10/2014    Review of systems:      Physical Exam    Heart and lungs: Regular rate and rhythm without rub or gallop, lungs are bilaterally clear.    HEENT: Normocephalic atraumatic eyes are anicteric    Other:     Pertinant exam for procedure: Soft nontender nondistended bowel sounds positive normoactive.    Planned proceedures: EGD, colonoscopy and indicated procedures. I have discussed the risks benefits and complications of procedures to include not limited to bleeding, infection, perforation and the risk of sedation and the Brittany Pittman wishes to proceed.    Brittany Sails, MD Gastroenterology 07/08/2016  8:30 AM

## 2016-07-09 ENCOUNTER — Encounter: Payer: Self-pay | Admitting: Gastroenterology

## 2016-07-09 LAB — SURGICAL PATHOLOGY

## 2016-08-26 ENCOUNTER — Other Ambulatory Visit: Payer: Self-pay | Admitting: Urology

## 2016-08-27 ENCOUNTER — Ambulatory Visit (INDEPENDENT_AMBULATORY_CARE_PROVIDER_SITE_OTHER): Payer: Medicare Other | Admitting: Podiatry

## 2016-08-27 ENCOUNTER — Other Ambulatory Visit: Payer: Self-pay | Admitting: Podiatry

## 2016-08-27 ENCOUNTER — Encounter: Payer: Self-pay | Admitting: Podiatry

## 2016-08-27 ENCOUNTER — Ambulatory Visit (INDEPENDENT_AMBULATORY_CARE_PROVIDER_SITE_OTHER): Payer: Medicare Other

## 2016-08-27 VITALS — BP 164/84 | HR 75 | Resp 16

## 2016-08-27 DIAGNOSIS — M84375A Stress fracture, left foot, initial encounter for fracture: Secondary | ICD-10-CM

## 2016-08-27 DIAGNOSIS — M79671 Pain in right foot: Secondary | ICD-10-CM

## 2016-08-27 DIAGNOSIS — M79672 Pain in left foot: Secondary | ICD-10-CM | POA: Diagnosis not present

## 2016-08-27 DIAGNOSIS — S9032XA Contusion of left foot, initial encounter: Secondary | ICD-10-CM | POA: Diagnosis not present

## 2016-08-27 NOTE — Progress Notes (Signed)
Patient ID: Brittany Pittman, female   DOB: [birth year], 70 y.o.   MRN: [id number] Subjective:  Patient presents with pain to the left forefoot which is been going on for approximately one month now. Patient relates pain to the third toe of the left foot. She denies trauma or any change in shoe gear or activities. Patient presents today for further treatment and evaluation    Objective/Physical Exam General: The patient is alert and oriented x3 in no acute distress.  Dermatology: Skin is warm, dry and supple bilateral lower extremities. Negative for open lesions or macerations.  Vascular: Palpable pedal pulses bilaterally. No edema or erythema noted. Capillary refill within normal limits.  Neurological: Epicritic and protective threshold grossly intact bilaterally.   Musculoskeletal Exam: Edema with ecchymosis noted locally around the third metatarsal head left foot. Range of motion within normal limits to all pedal and ankle joints bilateral. Muscle strength 5/5 in all groups bilateral.   Radiographic Exam: Nondisplaced closed fracture noted to the neck of the third metatarsal left foot. Callus formation noted.  Normal osseous mineralization. Joint spaces preserved. No fracture/dislocation/boney destruction.    Assessment: #1 fracture third metatarsal neck left foot-closed, nondisplaced #2 edema left foot #3 ecchymosis left foot  #4 pain in left foot   Plan of Care:  #1 Patient was evaluated. #2 x-rays were reviewed. #3 a postoperative shoe was dispensed against today. #4 instructed the patient to reduce activity and rhythm postop shoe at all times. #5 patient is to return to clinic in 4 weeks   Dr. Edrick Kins, [place]

## 2016-08-27 NOTE — Patient Instructions (Signed)
Metatarsal Fracture A metatarsal fracture is a break in a metatarsal bone. Metatarsal bones connect your toe bones to your ankle bones. CAUSES This type of fracture may be caused by:  A sudden twisting of your foot.  A fall onto your foot.  Overuse or repetitive exercise. RISK FACTORS This condition is more likely to develop in people who:  Play contact sports.  Have a bone disease.  Have a low calcium level. SYMPTOMS Symptoms of this condition include:  Pain that is worse when walking or standing.  Pain when pressing on the foot or moving the toes.  Swelling.  Bruising on the top or bottom of the foot.  A foot that appears shorter than the other one. DIAGNOSIS This condition is diagnosed with a physical exam. You may also have imaging tests, such as:  X-rays.  A CT scan.  MRI. TREATMENT Treatment for this condition depends on its severity and whether a bone has moved out of place. Treatment may involve:  Rest.  Wearing foot support such as a cast, splint, or boot for several weeks.  Using crutches.  Surgery to move bones back into the right position. Surgery is usually needed if there are many pieces of broken bone or bones that are very out of place (displaced fracture).  Physical therapy. This may be needed to help you regain full movement and strength in your foot. You will need to return to your health care provider to have X-rays taken until your bones heal. Your health care provider will look at the X-rays to make sure that your foot is healing well. HOME CARE INSTRUCTIONS  If You Have a Cast:  Do not stick anything inside the cast to scratch your skin. Doing that increases your risk of infection.  Check the skin around the cast every day. Report any concerns to your health care provider. You may put lotion on dry skin around the edges of the cast. Do not apply lotion to the skin underneath the cast.  Keep the cast clean and dry. If You Have a Splint  or a Supportive Boot:  Wear it as directed by your health care provider. Remove it only as directed by your health care provider.  Loosen it if your toes become numb and tingle, or if they turn cold and blue.  Keep it clean and dry. Bathing  Do not take baths, swim, or use a hot tub until your health care provider approves. Ask your health care provider if you can take showers. You may only be allowed to take sponge baths for bathing.  If your health care provider approves bathing and showering, cover the cast or splint with a watertight plastic bag to protect it from water. Do not let the cast or splint get wet. Managing Pain, Stiffness, and Swelling  If directed, apply ice to the injured area (if you have a splint, not a cast).  Put ice in a plastic bag.  Place a towel between your skin and the bag.  Leave the ice on for 20 minutes, 2-3 times per day.  Move your toes often to avoid stiffness and to lessen swelling.  Raise (elevate) the injured area above the level of your heart while you are sitting or lying down. Driving  Do not drive or operate heavy machinery while taking pain medicine.  Do not drive while wearing foot support on a foot that you use for driving. Activity  Return to your normal activities as directed by your health care   provider. Ask your health care provider what activities are safe for you.  Perform exercises as directed by your health care provider or physical therapist. Safety  Do not use the injured foot to support your body weight until your health care provider says that you can. Use crutches as directed by your health care provider. General Instructions  Do not put pressure on any part of the cast or splint until it is fully hardened. This may take several hours.  Do not use any tobacco products, including cigarettes, chewing tobacco, or e-cigarettes. Tobacco can delay bone healing. If you need help quitting, ask your health care  provider.  Take medicines only as directed by your health care provider.  Keep all follow-up visits as directed by your health care provider. This is important. SEEK MEDICAL CARE IF:  You have a fever.  Your cast, splint, or boot is too loose or too tight.  Your cast, splint, or boot is damaged.  Your pain medicine is not helping.  You have pain, tingling, or numbness in your foot that is not going away. SEEK IMMEDIATE MEDICAL CARE IF:  You have severe pain.  You have tingling or numbness in your foot that is getting worse.  Your foot feels cold or becomes numb.  Your foot changes color.   This information is not intended to replace advice given to you by your health care provider. Make sure you discuss any questions you have with your health care provider.   Document Released: 07/24/2002 Document Revised: 03/18/2015 Document Reviewed: 08/28/2014 Elsevier Interactive Patient Education 2016 Elsevier Inc.  

## 2016-08-27 NOTE — Addendum Note (Signed)
Addended by: Rip Harbour on: 08/27/2016 08:52 AM   Modules accepted: Orders

## 2016-08-27 NOTE — Progress Notes (Signed)
   Subjective:    Patient ID: Brittany Pittman, female    DOB: [birth year], 70 y.o.   MRN: [id number]  HPI    Review of Systems  All other systems reviewed and are negative.      Objective:   Physical Exam        Assessment & Plan:

## 2016-09-24 ENCOUNTER — Ambulatory Visit (INDEPENDENT_AMBULATORY_CARE_PROVIDER_SITE_OTHER): Payer: Medicare Other

## 2016-09-24 ENCOUNTER — Ambulatory Visit: Payer: Medicare Other | Admitting: Podiatry

## 2016-09-24 ENCOUNTER — Encounter: Payer: Self-pay | Admitting: Podiatry

## 2016-09-24 ENCOUNTER — Ambulatory Visit (INDEPENDENT_AMBULATORY_CARE_PROVIDER_SITE_OTHER): Payer: Medicare Other | Admitting: Podiatry

## 2016-09-24 DIAGNOSIS — M7752 Other enthesopathy of left foot: Secondary | ICD-10-CM | POA: Diagnosis not present

## 2016-09-24 DIAGNOSIS — M84375A Stress fracture, left foot, initial encounter for fracture: Secondary | ICD-10-CM

## 2016-09-24 DIAGNOSIS — R600 Localized edema: Secondary | ICD-10-CM | POA: Diagnosis not present

## 2016-09-24 DIAGNOSIS — M84375D Stress fracture, left foot, subsequent encounter for fracture with routine healing: Secondary | ICD-10-CM

## 2016-10-02 MED ORDER — BETAMETHASONE SOD PHOS & ACET 6 (3-3) MG/ML IJ SUSP: 3.0000 mg | Freq: Once | INTRAMUSCULAR | Status: DC

## 2016-10-02 NOTE — Progress Notes (Signed)
Patient ID: Brittany Pittman, female   DOB: [birth year], 70 y.o.   MRN: [id number] Subjective:  Patient presents with pain to the left forefoot which is been going on for approximately one month now. Patient relates pain to the third toe of the left foot. She denies trauma or any change in shoe gear or activities. Patient presents today for further treatment and evaluation    Objective/Physical Exam General: The patient is alert and oriented x3 in no acute distress.  Dermatology: Skin is warm, dry and supple bilateral lower extremities. Negative for open lesions or macerations.  Vascular: Palpable pedal pulses bilaterally. No edema or erythema noted. Capillary refill within normal limits.  Neurological: Epicritic and protective threshold grossly intact bilaterally.   Musculoskeletal Exam: Edema with ecchymosis noted locally around the third metatarsal head left foot. Range of motion within normal limits to all pedal and ankle joints bilateral. Muscle strength 5/5 in all groups bilateral.   Radiographic Exam: Nondisplaced closed fracture noted to the neck of the third metatarsal left foot. Callus formation noted.  Normal osseous mineralization. Joint spaces preserved. No fracture/dislocation/boney destruction.    Assessment: #1 fracture third metatarsal neck left foot with evidence of healing-closed, nondisplaced #2 capsulitis third MPJ left foot  #3 pain in left foot   Plan of Care:  #1 Patient was evaluated. #2 x-rays were reviewed. #3 injection of 0.5 mL Celestone Soluspan injected into the third MPJ left foot. #4 compression anklet dispensed #5 return to clinic in 4 weeks  Dr. Edrick Kins, [place]

## 2016-10-22 ENCOUNTER — Ambulatory Visit: Payer: Medicare Other | Admitting: Podiatry

## 2016-12-26 ENCOUNTER — Other Ambulatory Visit: Payer: Self-pay | Admitting: Urology

## 2016-12-28 ENCOUNTER — Other Ambulatory Visit: Payer: Self-pay | Admitting: Internal Medicine

## 2016-12-28 DIAGNOSIS — Z1231 Encounter for screening mammogram for malignant neoplasm of breast: Secondary | ICD-10-CM

## 2017-01-24 ENCOUNTER — Ambulatory Visit
Admission: RE | Admit: 2017-01-24 | Discharge: 2017-01-24 | Disposition: A | Discharge status: Home / Self Care | Payer: Medicare Other | Source: Ambulatory Visit | Attending: Internal Medicine | Admitting: Internal Medicine

## 2017-01-24 DIAGNOSIS — Z1231 Encounter for screening mammogram for malignant neoplasm of breast: Secondary | ICD-10-CM | POA: Diagnosis not present

## 2018-01-23 ENCOUNTER — Other Ambulatory Visit: Payer: Self-pay | Admitting: Internal Medicine

## 2018-01-23 DIAGNOSIS — Z1231 Encounter for screening mammogram for malignant neoplasm of breast: Secondary | ICD-10-CM

## 2018-01-30 ENCOUNTER — Ambulatory Visit
Admission: RE | Admit: 2018-01-30 | Discharge: 2018-01-30 | Disposition: A | Discharge status: Home / Self Care | Payer: Medicare Other | Source: Ambulatory Visit | Attending: Internal Medicine | Admitting: Internal Medicine

## 2018-01-30 DIAGNOSIS — Z1231 Encounter for screening mammogram for malignant neoplasm of breast: Secondary | ICD-10-CM | POA: Insufficient documentation

## 2018-08-11 ENCOUNTER — Encounter: Payer: Self-pay | Admitting: Anesthesiology

## 2018-08-11 ENCOUNTER — Encounter
Admission: RE | Disposition: A | Discharge status: Home / Self Care | Payer: Self-pay | Source: Ambulatory Visit | Attending: Gastroenterology

## 2018-08-11 ENCOUNTER — Ambulatory Visit: Payer: Medicare Other | Admitting: Anesthesiology

## 2018-08-11 ENCOUNTER — Ambulatory Visit
Admission: RE | Admit: 2018-08-11 | Discharge: 2018-08-11 | Disposition: A | Discharge status: Home / Self Care | Payer: Medicare Other | Source: Ambulatory Visit | Attending: Gastroenterology | Admitting: Gastroenterology

## 2018-08-11 DIAGNOSIS — Z8719 Personal history of other diseases of the digestive system: Secondary | ICD-10-CM | POA: Insufficient documentation

## 2018-08-11 DIAGNOSIS — F329 Major depressive disorder, single episode, unspecified: Secondary | ICD-10-CM | POA: Insufficient documentation

## 2018-08-11 DIAGNOSIS — K317 Polyp of stomach and duodenum: Secondary | ICD-10-CM | POA: Diagnosis not present

## 2018-08-11 DIAGNOSIS — E785 Hyperlipidemia, unspecified: Secondary | ICD-10-CM | POA: Diagnosis not present

## 2018-08-11 DIAGNOSIS — Z87891 Personal history of nicotine dependence: Secondary | ICD-10-CM | POA: Diagnosis not present

## 2018-08-11 DIAGNOSIS — K449 Diaphragmatic hernia without obstruction or gangrene: Secondary | ICD-10-CM | POA: Diagnosis not present

## 2018-08-11 DIAGNOSIS — K2270 Barrett's esophagus without dysplasia: Secondary | ICD-10-CM | POA: Insufficient documentation

## 2018-08-11 DIAGNOSIS — Z79899 Other long term (current) drug therapy: Secondary | ICD-10-CM | POA: Diagnosis not present

## 2018-08-11 HISTORY — DX: Other specified congenital malformations of intestine: Q43.8

## 2018-08-11 HISTORY — DX: Diverticulosis of intestine, part unspecified, without perforation or abscess without bleeding: K57.90

## 2018-08-11 HISTORY — PX: ESOPHAGOGASTRODUODENOSCOPY (EGD) WITH PROPOFOL: SHX5813

## 2018-08-11 SURGERY — ESOPHAGOGASTRODUODENOSCOPY (EGD) WITH PROPOFOL
Anesthesia: General

## 2018-08-11 MED ORDER — SODIUM CHLORIDE 0.9 % IV SOLN
INTRAVENOUS | Status: DC
  Administered 2018-08-11: 13:00:00 via INTRAVENOUS

## 2018-08-11 MED ORDER — FENTANYL CITRATE (PF) 100 MCG/2ML IJ SOLN
INTRAMUSCULAR | Status: AC
  Filled 2018-08-11: qty 2

## 2018-08-11 MED ORDER — PROPOFOL 500 MG/50ML IV EMUL
INTRAVENOUS | Status: AC
  Filled 2018-08-11: qty 50

## 2018-08-11 MED ORDER — FENTANYL CITRATE (PF) 100 MCG/2ML IJ SOLN
INTRAMUSCULAR | Status: DC | PRN
  Administered 2018-08-11: 50 ug via INTRAVENOUS

## 2018-08-11 MED ORDER — PROPOFOL 10 MG/ML IV BOLUS
INTRAVENOUS | Status: DC | PRN
  Administered 2018-08-11: 10 mg via INTRAVENOUS
  Administered 2018-08-11: 50 mg via INTRAVENOUS
  Administered 2018-08-11 (×3): 10 mg via INTRAVENOUS

## 2018-08-11 MED ORDER — PROPOFOL 500 MG/50ML IV EMUL
INTRAVENOUS | Status: DC | PRN
  Administered 2018-08-11: 120 ug/kg/min via INTRAVENOUS

## 2018-08-11 MED ORDER — LIDOCAINE HCL (CARDIAC) PF 100 MG/5ML IV SOSY
PREFILLED_SYRINGE | INTRAVENOUS | Status: DC | PRN
  Administered 2018-08-11: 100 mg via INTRAVENOUS

## 2018-08-11 NOTE — Anesthesia Postprocedure Evaluation (Signed)
Anesthesia Post Note  Patient: Brittany Pittman  Procedure(s) Performed: ESOPHAGOGASTRODUODENOSCOPY (EGD) WITH PROPOFOL (N/A )  Patient location during evaluation: Endoscopy Anesthesia Type: General Level of consciousness: awake and alert Pain management: pain level controlled Vital Signs Assessment: post-procedure vital signs reviewed and stable Respiratory status: spontaneous breathing, nonlabored ventilation, respiratory function stable and patient connected to nasal cannula oxygen Cardiovascular status: blood pressure returned to baseline and stable Postop Assessment: no apparent nausea or vomiting Anesthetic complications: no     Last Vitals:  Vitals:   08/11/18 1423 08/11/18 1433  BP: 115/72 125/81  Pulse: 60 66  Resp: 14 16  Temp:    SpO2: 100% 98%    Last Pain:  Vitals:   08/11/18 1423  TempSrc:   PainSc: 0-No pain                 Necola Bluestein S

## 2018-08-11 NOTE — Op Note (Signed)
[facility] Gastroenterology Patient Name: Brittany Pittman Procedure Date: 08/11/2018 1:36 PM MRN: [id number] Account #: [id number] Date of Birth: 12-Jun-1946 Admit Type: Outpatient Age: 72 Room: [facility] ENDO ROOM 1 Gender: Female Note Status: Finalized Procedure:            Upper GI endoscopy Indications:          Follow-up of Barrett's esophagus Providers:            Lollie Sails, MD Referring MD:         Leonie Douglas. Doy Hutching, MD (Referring MD) Medicines:            Monitored Anesthesia Care Complications:        No immediate complications. Procedure:            Pre-Anesthesia Assessment:                       - ASA Grade Assessment: II - A patient with mild                        systemic disease.                       After obtaining informed consent, the endoscope was                        passed under direct vision. Throughout the procedure,                        the patient's blood pressure, pulse, and oxygen                        saturations were monitored continuously. The Endoscope                        was introduced through the mouth, and advanced to the                        third part of duodenum. The upper GI endoscopy was                        accomplished without difficulty. The patient tolerated                        the procedure well. Findings:      There were esophageal mucosal changes secondary to established       long-segment Barrett's disease present in the distal esophagus. The       maximum longitudinal extent of these mucosal changes was 4 cm in length.       Mucosa was biopsied with a cold forceps for histology in a targeted       manner and in 4 quadrants at intervals of 2 cm at 33, 35 and 37 cm from       the incisors. A total of 3 specimen bottles were sent to pathology.      The exam of the esophagus was otherwise normal.      A small hiatal hernia was present.      A few 2 to 4 mm sessile polyps with no bleeding and no  stigmata of       recent bleeding were found in the gastric body. Biopsies were  taken with       a cold forceps for histology. Of note it was not possible to hold the       gastric vault open with insufflation, due to frequent eructation and       loss of insufflation.      The cardia and gastric fundus were normal on retroflexion.      The examined duodenum was normal. I was able to evaluatie the gastric       antrum and ;upper body/fundus however the gastric body itself was       difficult to evaluate fully. Impression:           - Esophageal mucosal changes secondary to established                        long-segment Barrett's disease. Biopsied.                       - Small hiatal hernia.                       - A few gastric polyps. Biopsied.                       - Normal examined duodenum. Recommendation:       - Discharge patient to home.                       - Full liquid diet today, then advance as tolerated to                        soft diet for 1 day.                       - Continue present medications.                       - Await pathology results. Procedure Code(s):    --- Professional ---                       [phone], Esophagogastroduodenoscopy, flexible, transoral;                        with biopsy, single or multiple Diagnosis Code(s):    --- Professional ---                       K22.70, Barrett's esophagus without dysplasia                       K44.9, Diaphragmatic hernia without obstruction or                        gangrene                       K31.7, Polyp of stomach and duodenum CPT copyright 2017 American Medical Association. All rights reserved. The codes documented in this report are preliminary and upon coder review may  be revised to meet current compliance requirements. Lollie Sails, MD 08/11/2018 2:17:44 PM This report has been signed electronically. Number of Addenda: 0 Note Initiated On: 08/11/2018 1:36 PM      [facility]

## 2018-08-11 NOTE — Anesthesia Preprocedure Evaluation (Signed)
Anesthesia Evaluation  Patient identified by MRN, date of birth, ID band Patient awake    Reviewed: Allergy & Precautions, NPO status , Patient's Chart, lab work & pertinent test results, reviewed documented beta blocker date and time   Airway Mallampati: II  TM Distance: >3 FB     Dental  (+) Chipped   Pulmonary former smoker,           Cardiovascular      Neuro/Psych PSYCHIATRIC DISORDERS Depression    GI/Hepatic (+) Hepatitis -  Endo/Other    Renal/GU      Musculoskeletal   Abdominal   Peds  Hematology  (+) anemia ,   Anesthesia Other Findings   Reproductive/Obstetrics                             Anesthesia Physical Anesthesia Plan  ASA: II  Anesthesia Plan: General   Post-op Pain Management:    Induction: Intravenous  PONV Risk Score and Plan:   Airway Management Planned:   Additional Equipment:   Intra-op Plan:   Post-operative Plan:   Informed Consent: I have reviewed the patients History and Physical, chart, labs and discussed the procedure including the risks, benefits and alternatives for the proposed anesthesia with the patient or authorized representative who has indicated his/her understanding and acceptance.     Plan Discussed with: CRNA  Anesthesia Plan Comments:         Anesthesia Quick Evaluation

## 2018-08-11 NOTE — Transfer of Care (Signed)
Immediate Anesthesia Transfer of Care Note  Patient: Brittany Pittman  Procedure(s) Performed: ESOPHAGOGASTRODUODENOSCOPY (EGD) WITH PROPOFOL (N/A )  Patient Location: PACU  Anesthesia Type:General  Level of Consciousness: drowsy  Airway & Oxygen Therapy: Patient Spontanous Breathing and Patient connected to nasal cannula oxygen  Post-op Assessment: Report given to RN and Post -op Vital signs reviewed and stable  Post vital signs: Reviewed and stable  Last Vitals:  Vitals Value Taken Time  BP    Temp    Pulse    Resp    SpO2      Last Pain:  Vitals:   08/11/18 1319  TempSrc: Oral  PainSc: 0-No pain         Complications: No apparent anesthesia complications

## 2018-08-11 NOTE — Anesthesia Post-op Follow-up Note (Signed)
Anesthesia QCDR form completed.        

## 2018-08-11 NOTE — H&P (Signed)
Outpatient short stay form Pre-procedure 08/11/2018 1:40 PM Brittany Sails MD  Primary Physician: Fulton Reek, MD  Reason for visit: EGD  History of present illness: Patient is a 72 year old female presenting today for EGD.  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  She does take a daily proton pump inhibitor, pantoprazole, and has no problems with heartburn or dysphagia.  Her last EGD was 07/08/2016.  She takes no aspirin products or blood thinning agents.  Current Facility-Administered Medications:  .  0.9 %  sodium chloride infusion, , Intravenous, Continuous, Brittany Sails, MD, Last Rate: 20 mL/hr at 08/11/18 1324  Facility-Administered Medications Prior to Admission  Medication Dose Route Frequency Provider Last Rate Last Dose  . betamethasone acetate-betamethasone sodium phosphate (CELESTONE) injection 3 mg  3 mg Intramuscular Once Edrick Kins, DPM       Medications Prior to Admission  Medication Sig Dispense Refill Last Dose  . atorvastatin (LIPITOR) 40 MG tablet TAKE 1 TABLET (40 MG TOTAL) BY MOUTH ONCE DAILY.  8 08/10/2018 at Unknown time  . MYRBETRIQ 50 MG TB24 tablet TAKE 1 TABLET (50 MG TOTAL) BY MOUTH DAILY. 30 tablet 3 Past Week at Unknown time  . pantoprazole (PROTONIX) 40 MG tablet TAKE 1 TABLET (40 MG TOTAL) BY MOUTH 2 (TWO) TIMES DAILY.  11 08/10/2018 at Unknown time  . baclofen (LIORESAL) 10 MG tablet Take 10 mg by mouth 3 (three) times daily.   Not Taking at Unknown time  . citalopram (CELEXA) 20 MG tablet TAKE 1 TABLET (20 MG TOTAL) BY MOUTH ONCE DAILY.  10 Not Taking at Unknown time  . conjugated estrogens (PREMARIN) vaginal cream Place 1 Applicatorful vaginally daily. Pea sized amount per urethra M/ W/ Fr qhs (Patient not taking: Reported on 08/11/2018) 42.5 g 12 Not Taking at Unknown time  . polyethylene glycol powder (GLYCOLAX/MIRALAX) powder MIX 17 GRAMS (1 SCOOP) BY MOUTH ONCE A DAY OR AS DIRECTED FOR  COLONIC PREP  0 Taking  . venlafaxine XR (EFFEXOR-XR) 75 MG 24 hr capsule Take 75 mg by mouth daily with breakfast.   Not Taking at Unknown time     No Known Allergies   Past Medical History:  Diagnosis Date  . 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 Allergy to environmental factors 04/10/2014  . 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  . Blood pressure elevated without history of HTN 04/10/2014  . Depression   . Diverticulosis   . Hepatitis    history of hepatitis A  . History of chicken pox   . HLD (hyperlipidemia) 04/10/2014  . Redundant colon     Review of systems:      Physical Exam    Heart and lungs: Regular rate and rhythm without rub or gallop, lungs are bilaterally clear.    HEENT: Normocephalic atraumatic eyes are anicteric    Other:    Pertinant exam for procedure: Soft nontender nondistended bowel sounds positive normoactive.    Planned proceedures: EGD and indicated procedures. I have discussed the risks benefits and complications of procedures to include not limited to bleeding, infection, perforation and the risk of sedation and the patient wishes to proceed.    Brittany Sails, MD Gastroenterology 08/11/2018  1:40 PM

## 2018-08-11 NOTE — Anesthesia Procedure Notes (Signed)
Performed by: Yanin Muhlestein, CRNA Pre-anesthesia Checklist: Patient identified, Emergency Drugs available, Suction available, Patient being monitored and Timeout performed Patient Re-evaluated:Patient Re-evaluated prior to induction Oxygen Delivery Method: Nasal cannula Induction Type: IV induction       

## 2018-08-14 ENCOUNTER — Encounter: Payer: Self-pay | Admitting: Gastroenterology

## 2018-08-15 LAB — SURGICAL PATHOLOGY

## 2019-01-01 ENCOUNTER — Other Ambulatory Visit: Payer: Self-pay | Admitting: Internal Medicine

## 2019-01-01 DIAGNOSIS — Z1231 Encounter for screening mammogram for malignant neoplasm of breast: Secondary | ICD-10-CM

## 2019-01-05 ENCOUNTER — Other Ambulatory Visit: Payer: Self-pay | Admitting: Neurology

## 2019-01-05 DIAGNOSIS — G4452 New daily persistent headache (NDPH): Secondary | ICD-10-CM

## 2019-01-16 ENCOUNTER — Other Ambulatory Visit: Payer: Self-pay

## 2019-01-16 ENCOUNTER — Ambulatory Visit
Admission: RE | Admit: 2019-01-16 | Discharge: 2019-01-16 | Disposition: A | Discharge status: Home / Self Care | Payer: Medicare Other | Source: Ambulatory Visit | Attending: Neurology | Admitting: Neurology

## 2019-01-16 DIAGNOSIS — G4452 New daily persistent headache (NDPH): Secondary | ICD-10-CM | POA: Diagnosis not present

## 2019-05-25 ENCOUNTER — Other Ambulatory Visit: Payer: Self-pay

## 2019-05-25 ENCOUNTER — Ambulatory Visit
Admission: RE | Admit: 2019-05-25 | Discharge: 2019-05-25 | Disposition: A | Discharge status: Home / Self Care | Payer: Medicare Other | Source: Ambulatory Visit | Attending: Internal Medicine | Admitting: Internal Medicine

## 2019-05-25 DIAGNOSIS — Z1231 Encounter for screening mammogram for malignant neoplasm of breast: Secondary | ICD-10-CM | POA: Diagnosis not present

## 2019-07-19 DIAGNOSIS — G44229 Chronic tension-type headache, not intractable: Secondary | ICD-10-CM | POA: Insufficient documentation

## 2020-04-18 ENCOUNTER — Other Ambulatory Visit: Payer: Self-pay | Admitting: Internal Medicine

## 2020-04-18 DIAGNOSIS — Z1231 Encounter for screening mammogram for malignant neoplasm of breast: Secondary | ICD-10-CM

## 2020-05-30 ENCOUNTER — Ambulatory Visit
Admission: RE | Admit: 2020-05-30 | Discharge: 2020-05-30 | Disposition: A | Discharge status: Home / Self Care | Payer: Medicare Other | Source: Ambulatory Visit | Attending: Internal Medicine | Admitting: Internal Medicine

## 2020-05-30 DIAGNOSIS — Z1231 Encounter for screening mammogram for malignant neoplasm of breast: Secondary | ICD-10-CM | POA: Diagnosis not present

## 2020-06-02 DIAGNOSIS — F430 Acute stress reaction: Secondary | ICD-10-CM | POA: Insufficient documentation

## 2021-04-28 ENCOUNTER — Other Ambulatory Visit: Payer: Self-pay | Admitting: Internal Medicine

## 2021-04-28 DIAGNOSIS — Z1231 Encounter for screening mammogram for malignant neoplasm of breast: Secondary | ICD-10-CM

## 2021-06-16 ENCOUNTER — Ambulatory Visit
Admission: RE | Admit: 2021-06-16 | Discharge: 2021-06-16 | Disposition: A | Discharge status: Home / Self Care | Payer: Medicare Other | Source: Ambulatory Visit | Attending: Internal Medicine | Admitting: Internal Medicine

## 2021-06-16 ENCOUNTER — Other Ambulatory Visit: Payer: Self-pay

## 2021-06-16 DIAGNOSIS — Z1231 Encounter for screening mammogram for malignant neoplasm of breast: Secondary | ICD-10-CM | POA: Diagnosis not present

## 2022-07-02 ENCOUNTER — Encounter: Payer: Self-pay | Admitting: *Deleted

## 2022-07-05 ENCOUNTER — Other Ambulatory Visit: Payer: Self-pay

## 2022-07-05 ENCOUNTER — Encounter: Payer: Self-pay | Admitting: *Deleted

## 2022-07-05 ENCOUNTER — Ambulatory Visit
Admission: RE | Admit: 2022-07-05 | Discharge: 2022-07-05 | Disposition: A | Discharge status: Home / Self Care | Payer: Medicare Other | Attending: Gastroenterology | Admitting: Gastroenterology

## 2022-07-05 ENCOUNTER — Encounter
Admission: RE | Disposition: A | Discharge status: Home / Self Care | Payer: Self-pay | Source: Home / Self Care | Attending: Gastroenterology

## 2022-07-05 ENCOUNTER — Ambulatory Visit: Payer: Medicare Other | Admitting: Certified Registered"

## 2022-07-05 DIAGNOSIS — K5730 Diverticulosis of large intestine without perforation or abscess without bleeding: Secondary | ICD-10-CM | POA: Insufficient documentation

## 2022-07-05 DIAGNOSIS — K2270 Barrett's esophagus without dysplasia: Secondary | ICD-10-CM | POA: Diagnosis not present

## 2022-07-05 DIAGNOSIS — D229 Melanocytic nevi, unspecified: Secondary | ICD-10-CM | POA: Diagnosis not present

## 2022-07-05 DIAGNOSIS — Z800 Family history of malignant neoplasm of digestive organs: Secondary | ICD-10-CM | POA: Diagnosis not present

## 2022-07-05 DIAGNOSIS — E785 Hyperlipidemia, unspecified: Secondary | ICD-10-CM | POA: Insufficient documentation

## 2022-07-05 DIAGNOSIS — R194 Change in bowel habit: Secondary | ICD-10-CM | POA: Diagnosis present

## 2022-07-05 DIAGNOSIS — K5900 Constipation, unspecified: Secondary | ICD-10-CM | POA: Insufficient documentation

## 2022-07-05 DIAGNOSIS — F32A Depression, unspecified: Secondary | ICD-10-CM | POA: Insufficient documentation

## 2022-07-05 DIAGNOSIS — K2100 Gastro-esophageal reflux disease with esophagitis, without bleeding: Secondary | ICD-10-CM | POA: Insufficient documentation

## 2022-07-05 DIAGNOSIS — Z87891 Personal history of nicotine dependence: Secondary | ICD-10-CM | POA: Insufficient documentation

## 2022-07-05 DIAGNOSIS — I10 Essential (primary) hypertension: Secondary | ICD-10-CM | POA: Insufficient documentation

## 2022-07-05 HISTORY — PX: ESOPHAGOGASTRODUODENOSCOPY (EGD) WITH PROPOFOL: SHX5813

## 2022-07-05 HISTORY — PX: COLONOSCOPY WITH PROPOFOL: SHX5780

## 2022-07-05 SURGERY — COLONOSCOPY WITH PROPOFOL
Anesthesia: General

## 2022-07-05 MED ORDER — PROPOFOL 10 MG/ML IV BOLUS
INTRAVENOUS | Status: DC | PRN
  Administered 2022-07-05: 60 mg via INTRAVENOUS

## 2022-07-05 MED ORDER — PROPOFOL 500 MG/50ML IV EMUL
INTRAVENOUS | Status: DC | PRN
  Administered 2022-07-05: 145 ug/kg/min via INTRAVENOUS

## 2022-07-05 MED ORDER — EPHEDRINE SULFATE (PRESSORS) 50 MG/ML IJ SOLN
INTRAMUSCULAR | Status: DC | PRN
  Administered 2022-07-05: 10 mg via INTRAVENOUS

## 2022-07-05 MED ORDER — LIDOCAINE HCL (CARDIAC) PF 100 MG/5ML IV SOSY
PREFILLED_SYRINGE | INTRAVENOUS | Status: DC | PRN
  Administered 2022-07-05: 40 mg via INTRAVENOUS
  Administered 2022-07-05: 60 mg via INTRAVENOUS

## 2022-07-05 MED ORDER — SODIUM CHLORIDE 0.9 % IV SOLN: INTRAVENOUS | Status: DC

## 2022-07-05 MED ORDER — GLYCOPYRROLATE 0.2 MG/ML IJ SOLN
INTRAMUSCULAR | Status: DC | PRN
  Administered 2022-07-05: .2 mg via INTRAVENOUS

## 2022-07-05 NOTE — Transfer of Care (Addendum)
Immediate Anesthesia Transfer of Care Note  Patient: Brittany Pittman  Procedure(s) Performed: COLONOSCOPY WITH PROPOFOL ESOPHAGOGASTRODUODENOSCOPY (EGD) WITH PROPOFOL  Patient Location: Endoscopy Unit  Anesthesia Type:General  Level of Consciousness: awake, drowsy and patient cooperative  Airway & Oxygen Therapy: Patient Spontanous Breathing and Patient connected to face mask oxygen  Post-op Assessment: Report given to RN and Post -op Vital signs reviewed and stable  Post vital signs: Reviewed and stable  Last Vitals:  Vitals Value Taken Time  BP 105/67 07/05/22 1247  Temp 36.8 C 07/05/22 1239  Pulse 68 07/05/22 1255  Resp 16 07/05/22 1255  SpO2 97 % 07/05/22 1255  Vitals shown include unvalidated device data.  Last Pain:  Vitals:   07/05/22 1239  TempSrc: Temporal  PainSc: Asleep         Complications: No notable events documented.

## 2022-07-05 NOTE — Anesthesia Procedure Notes (Signed)
Procedure Name: General with mask airway Date/Time: 07/05/2022 12:13 PM  Performed by: Kelton Pillar, CRNAPre-anesthesia Checklist: Patient identified, Emergency Drugs available, Suction available and Patient being monitored Patient Re-evaluated:Patient Re-evaluated prior to induction Oxygen Delivery Method: Simple face mask Induction Type: IV induction Placement Confirmation: positive ETCO2, CO2 detector and breath sounds checked- equal and bilateral Dental Injury: Teeth and Oropharynx as per pre-operative assessment

## 2022-07-05 NOTE — H&P (Signed)
Outpatient short stay form Pre-procedure 07/05/2022  Brittany Rubenstein, MD  Primary Physician: Idelle Crouch, MD  Reason for visit:  GERD/Melena/Change in bowel habits  History of present illness:    76 y/o lady with long segment BE's without dysplasia and family history of esophageal cancer in brother and mother here for EGD for GERD and change in bowel habits. Has been having alternating constipation and diarrhea. No blood thinners. No significant abdominal surgeries.     Current Facility-Administered Medications:    0.9 %  sodium chloride infusion, , Intravenous, Continuous, Zannie Runkle, Hilton Cork, MD, Last Rate: 20 mL/hr at 07/05/22 1136, New Bag at 07/05/22 1136  Facility-Administered Medications Prior to Admission  Medication Dose Route Frequency Provider Last Rate Last Admin   betamethasone acetate-betamethasone sodium phosphate (CELESTONE) injection 3 mg  3 mg Intramuscular Once Edrick Kins, DPM       Medications Prior to Admission  Medication Sig Dispense Refill Last Dose   atorvastatin (LIPITOR) 40 MG tablet TAKE 1 TABLET (40 MG TOTAL) BY MOUTH ONCE DAILY.  8 07/04/2022   baclofen (LIORESAL) 10 MG tablet Take 10 mg by mouth 3 (three) times daily.   07/04/2022   citalopram (CELEXA) 20 MG tablet TAKE 1 TABLET (20 MG TOTAL) BY MOUTH ONCE DAILY.  10 07/04/2022   conjugated estrogens (PREMARIN) vaginal cream Place 1 Applicatorful vaginally daily. Pea sized amount per urethra M/ W/ Fr qhs 42.5 g 12 07/04/2022   MYRBETRIQ 50 MG TB24 tablet TAKE 1 TABLET (50 MG TOTAL) BY MOUTH DAILY. 30 tablet 3 07/04/2022   pantoprazole (PROTONIX) 40 MG tablet TAKE 1 TABLET (40 MG TOTAL) BY MOUTH 2 (TWO) TIMES DAILY.  11 07/04/2022   polyethylene glycol powder (GLYCOLAX/MIRALAX) powder MIX 17 GRAMS (1 SCOOP) BY MOUTH ONCE A DAY OR AS DIRECTED FOR COLONIC PREP  0 07/04/2022   venlafaxine XR (EFFEXOR-XR) 75 MG 24 hr capsule Take 75 mg by mouth daily with breakfast.   07/04/2022     No Known  Allergies   Past Medical History:  Diagnosis Date   Absolute anemia 04/10/2014   Allergy to environmental factors 04/10/2014   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   Blood pressure elevated without history of HTN 04/10/2014   Depression    Diverticulosis    Hepatitis    history of hepatitis A   History of chicken pox    HLD (hyperlipidemia) 04/10/2014   Redundant colon     Review of systems:  Otherwise negative.    Physical Exam  Gen: Alert, oriented. Appears stated age.  HEENT: PERRLA. Lungs: No respiratory distress CV: RRR Abd: soft, benign, no masses Ext: No edema    Planned procedures: Proceed with EGD/colonoscopy. The patient understands the nature of the planned procedure, indications, risks, alternatives and potential complications including but not limited to bleeding, infection, perforation, damage to internal organs and possible oversedation/side effects from anesthesia. The patient agrees and gives consent to proceed.  Please refer to procedure notes for findings, recommendations and patient disposition/instructions.     Brittany Rubenstein, MD [facility] Gastroenterology

## 2022-07-05 NOTE — Op Note (Signed)
[facility] Gastroenterology Patient Name: Gwenyth Dingee Procedure Date: 07/05/2022 12:00 PM MRN: [id number] Account #: [id number] Date of Birth: [birth year] Admit Type: Outpatient Age: 76 Room: [facility] ENDO ROOM 3 Gender: Female Note Status: Finalized Instrument Name: Jasper Riling [id number] Procedure:             Colonoscopy Indications:           Change in bowel habits Providers:             Andrey Farmer MD, MD Medicines:             Monitored Anesthesia Care Complications:         No immediate complications. Procedure:             Pre-Anesthesia Assessment:                        - Prior to the procedure, a History and Physical was                         performed, and patient medications and allergies were                         reviewed. The patient is competent. The risks and                         benefits of the procedure and the sedation options and                         risks were discussed with the patient. All questions                         were answered and informed consent was obtained.                         Patient identification and proposed procedure were                         verified by the physician, the nurse, the                         anesthesiologist, the anesthetist and the technician                         in the endoscopy suite. Mental Status Examination:                         alert and oriented. Airway Examination: normal                         oropharyngeal airway and neck mobility. Respiratory                         Examination: clear to auscultation. CV Examination:                         normal. Prophylactic Antibiotics: The patient does not                         require prophylactic antibiotics. Prior  Anticoagulants: The patient has taken no previous                         anticoagulant or antiplatelet agents. ASA Grade                         Assessment: II - A patient with mild  systemic disease.                         After reviewing the risks and benefits, the patient                         was deemed in satisfactory condition to undergo the                         procedure. The anesthesia plan was to use monitored                         anesthesia care (MAC). Immediately prior to                         administration of medications, the patient was                         re-assessed for adequacy to receive sedatives. The                         heart rate, respiratory rate, oxygen saturations,                         blood pressure, adequacy of pulmonary ventilation, and                         response to care were monitored throughout the                         procedure. The physical status of the patient was                         re-assessed after the procedure.                        After obtaining informed consent, the colonoscope was                         passed under direct vision. Throughout the procedure,                         the patient's blood pressure, pulse, and oxygen                         saturations were monitored continuously. The                         Colonoscope was introduced through the anus and                         advanced to the the cecum, identified by appendiceal  orifice and ileocecal valve. The colonoscopy was                         somewhat difficult due to a redundant colon. The                         patient tolerated the procedure well. The quality of                         the bowel preparation was good. Findings:      The perianal and digital rectal examinations were normal.      A few small-mouthed diverticula were found in the sigmoid colon.      Single blue rubber nevus in sigmoid colon      The exam was otherwise without abnormality on direct and retroflexion       views. Impression:            - Diverticulosis in the sigmoid colon.                        - Blue rubber  nevus in sigmoid colon                        - The examination was otherwise normal on direct and                         retroflexion views.                        - No specimens collected. Recommendation:        - Discharge patient to home.                        - Resume previous diet.                        - Continue present medications.                        - Repeat colonoscopy is not recommended for screening                         purposes.                        - Return to referring physician as previously                         scheduled. Procedure Code(s):     --- Professional ---                        [phone], Colonoscopy, flexible; diagnostic, including                         collection of specimen(s) by brushing or washing, when                         performed (separate procedure) Diagnosis Code(s):     --- Professional ---  D22.9, Melanocytic nevi, unspecified                        K57.30, Diverticulosis of large intestine without                         perforation or abscess without bleeding                        R19.4, Change in bowel habit CPT copyright 2019 American Medical Association. All rights reserved. The codes documented in this report are preliminary and upon coder review may  be revised to meet current compliance requirements. Andrey Farmer MD, MD 07/05/2022 12:51:19 PM Number of Addenda: 0 Note Initiated On: 07/05/2022 12:00 PM Scope Withdrawal Time: 0 hours 8 minutes 34 seconds  Total Procedure Duration: 0 hours 16 minutes 0 seconds  Estimated Blood Loss:  Estimated blood loss: none.      [facility]

## 2022-07-05 NOTE — Op Note (Signed)
[facility] Gastroenterology Patient Name: Brittany Pittman Procedure Date: 07/05/2022 12:00 PM MRN: [id number] Account #: [id number] Date of Birth: [birth year] Admit Type: Outpatient Age: 76 Room: [facility] ENDO ROOM 3 Gender: Female Note Status: Finalized Instrument Name: Upper Endoscope [id number] Procedure:             Upper GI endoscopy Indications:           Gastro-esophageal reflux disease, Melena Providers:             Andrey Farmer MD, MD Medicines:             Monitored Anesthesia Care Complications:         No immediate complications. Estimated blood loss:                         Minimal. Procedure:             Pre-Anesthesia Assessment:                        - Prior to the procedure, a History and Physical was                         performed, and patient medications and allergies were                         reviewed. The patient is competent. The risks and                         benefits of the procedure and the sedation options and                         risks were discussed with the patient. All questions                         were answered and informed consent was obtained.                         Patient identification and proposed procedure were                         verified by the physician, the nurse, the                         anesthesiologist, the anesthetist and the technician                         in the endoscopy suite. Mental Status Examination:                         alert and oriented. Airway Examination: normal                         oropharyngeal airway and neck mobility. Respiratory                         Examination: clear to auscultation. CV Examination:                         normal. Prophylactic Antibiotics: The patient does not  require prophylactic antibiotics. Prior                         Anticoagulants: The patient has taken no previous                         anticoagulant or antiplatelet  agents. ASA Grade                         Assessment: II - A patient with mild systemic disease.                         After reviewing the risks and benefits, the patient                         was deemed in satisfactory condition to undergo the                         procedure. The anesthesia plan was to use monitored                         anesthesia care (MAC). Immediately prior to                         administration of medications, the patient was                         re-assessed for adequacy to receive sedatives. The                         heart rate, respiratory rate, oxygen saturations,                         blood pressure, adequacy of pulmonary ventilation, and                         response to care were monitored throughout the                         procedure. The physical status of the patient was                         re-assessed after the procedure.                        After obtaining informed consent, the endoscope was                         passed under direct vision. Throughout the procedure,                         the patient's blood pressure, pulse, and oxygen                         saturations were monitored continuously. The                         Endosonoscope was introduced through the mouth, and  advanced to the second part of duodenum. The upper GI                         endoscopy was accomplished without difficulty. The                         patient tolerated the procedure well. Findings:      The esophagus and gastroesophageal junction were examined with white       light and narrow band imaging (NBI). There were esophageal mucosal       changes classified as Barrett's stage C4-M4 per Prague criteria. These       changes involved the mucosa at the upper extent of the gastric folds (34       cm from the incisors) extending to the Z-line (30 cm from the incisors).       Circumferential salmon-colored mucosa was  present from 30 to 34 cm and       hiatal narrowing was identified at 39 cm. The maximum longitudinal       extent of these esophageal mucosal changes was 4 cm in length. Mucosa       was biopsied with a cold forceps for histology in 4 quadrants at       intervals of 2 cm in the lower third of the esophagus. A total of 3       specimen bottles were sent to pathology. Estimated blood loss was       minimal.      The entire examined stomach was normal.      The examined duodenum was normal. Impression:            - Esophageal mucosal changes classified as Barrett's                         stage C4-M4 per Prague criteria. Biopsied.                        - Normal stomach.                        - Normal examined duodenum. Recommendation:        - Discharge patient to home.                        - Resume previous diet.                        - Continue present medications.                        - Await pathology results.                        - Return to referring physician as previously                         scheduled. Procedure Code(s):     --- Professional ---                        [phone], Esophagogastroduodenoscopy, flexible,                         transoral;  with biopsy, single or multiple Diagnosis Code(s):     --- Professional ---                        K22.70, Barrett's esophagus without dysplasia                        K21.9, Gastro-esophageal reflux disease without                         esophagitis                        K92.1, Melena (includes Hematochezia) CPT copyright 2019 American Medical Association. All rights reserved. The codes documented in this report are preliminary and upon coder review may  be revised to meet current compliance requirements. Andrey Farmer MD, MD 07/05/2022 12:42:16 PM Number of Addenda: 0 Note Initiated On: 07/05/2022 12:00 PM Estimated Blood Loss:  Estimated blood loss was minimal.      [facility]

## 2022-07-05 NOTE — Anesthesia Postprocedure Evaluation (Signed)
Anesthesia Post Note  Patient: Audriella Blakeley Macphee  Procedure(s) Performed: COLONOSCOPY WITH PROPOFOL ESOPHAGOGASTRODUODENOSCOPY (EGD) WITH PROPOFOL  Patient location during evaluation: Endoscopy Anesthesia Type: General Level of consciousness: awake and alert Pain management: pain level controlled Vital Signs Assessment: post-procedure vital signs reviewed and stable Respiratory status: spontaneous breathing, nonlabored ventilation, respiratory function stable and patient connected to nasal cannula oxygen Cardiovascular status: blood pressure returned to baseline and stable Postop Assessment: no apparent nausea or vomiting Anesthetic complications: no   No notable events documented.   Last Vitals:  Vitals:   07/05/22 1124 07/05/22 1239  BP: (!) 144/70   Pulse: 63 64  Resp: 18   Temp: (!) 36.3 C 36.8 C  SpO2: 97%     Last Pain:  Vitals:   07/05/22 1259  TempSrc:   PainSc: 0-No pain                 Arita Miss

## 2022-07-05 NOTE — Interval H&P Note (Signed)
History and Physical Interval Note:  07/05/2022 11:56 AM  Brittany Pittman  has presented today for surgery, with the diagnosis of bowel habit changes,dypepsia,Barett's esophagus wo dysplasia.  The various methods of treatment have been discussed with the patient and family. After consideration of risks, benefits and other options for treatment, the patient has consented to  Procedure(s): COLONOSCOPY WITH PROPOFOL (N/A) ESOPHAGOGASTRODUODENOSCOPY (EGD) WITH PROPOFOL (N/A) as a surgical intervention.  The patient's history has been reviewed, patient examined, no change in status, stable for surgery.  I have reviewed the patient's chart and labs.  Questions were answered to the patient's satisfaction.     Lesly Rubenstein  Ok to proceed with EGD/Colonoscopy

## 2022-07-05 NOTE — Anesthesia Preprocedure Evaluation (Signed)
Anesthesia Evaluation  Patient identified by MRN, date of birth, ID band Patient awake    Reviewed: Allergy & Precautions, NPO status , Patient's Chart, lab work & pertinent test results, reviewed documented beta blocker date and time   History of Anesthesia Complications Negative for: history of anesthetic complications  Airway Mallampati: II  TM Distance: >3 FB Neck ROM: Full    Dental  (+) Chipped, Poor Dentition, Missing   Pulmonary neg pulmonary ROS, neg sleep apnea, neg COPD, Patient abstained from smoking.Not current smoker, former smoker,    Pulmonary exam normal breath sounds clear to auscultation       Cardiovascular Exercise Tolerance: Good METS(-) hypertension(-) CAD and (-) Past MI negative cardio ROS  (-) dysrhythmias  Rhythm:Regular Rate:Normal - Systolic murmurs    Neuro/Psych PSYCHIATRIC DISORDERS Depression negative neurological ROS     GI/Hepatic neg GERD  ,(+)     (-) substance abuse  ,   Endo/Other  neg diabetes  Renal/GU negative Renal ROS     Musculoskeletal   Abdominal   Peds  Hematology  (+) Blood dyscrasia, anemia ,   Anesthesia Other Findings Past Medical History: 04/10/2014: Absolute anemia 04/10/2014: Allergy to environmental factors 04/10/2014: 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: Blood pressure elevated without history of HTN No date: Depression No date: Diverticulosis No date: Hepatitis     Comment:  history of hepatitis A No date: History of chicken pox 04/10/2014: HLD (hyperlipidemia) No date: Redundant colon  Reproductive/Obstetrics                             Anesthesia Physical  Anesthesia Plan  ASA: 2  Anesthesia Plan: General   Post-op Pain Management: Minimal or no pain anticipated   Induction: Intravenous  PONV Risk Score and Plan: 3 and Propofol infusion, TIVA and Ondansetron  Airway Management Planned: Nasal Cannula  Additional  Equipment: None  Intra-op Plan:   Post-operative Plan:   Informed Consent: I have reviewed the patients History and Physical, chart, labs and discussed the procedure including the risks, benefits and alternatives for the proposed anesthesia with the patient or authorized representative who has indicated his/her understanding and acceptance.     Dental advisory given  Plan Discussed with: CRNA  Anesthesia Plan Comments: (Discussed risks of anesthesia with patient, including possibility of difficulty with spontaneous ventilation under anesthesia necessitating airway intervention, PONV, and rare risks such as cardiac or respiratory or neurological events, and allergic reactions. Discussed the role of CRNA in patient's perioperative care. Patient understands.)        Anesthesia Quick Evaluation

## 2022-07-06 ENCOUNTER — Encounter: Payer: Self-pay | Admitting: Gastroenterology

## 2022-07-06 LAB — SURGICAL PATHOLOGY

## 2022-07-29 ENCOUNTER — Other Ambulatory Visit: Payer: Self-pay | Admitting: Internal Medicine

## 2022-07-29 DIAGNOSIS — Z1231 Encounter for screening mammogram for malignant neoplasm of breast: Secondary | ICD-10-CM

## 2022-08-20 ENCOUNTER — Ambulatory Visit
Admission: RE | Admit: 2022-08-20 | Discharge: 2022-08-20 | Disposition: A | Discharge status: Home / Self Care | Payer: Medicare Other | Source: Ambulatory Visit | Attending: Internal Medicine | Admitting: Internal Medicine

## 2022-08-20 DIAGNOSIS — Z1231 Encounter for screening mammogram for malignant neoplasm of breast: Secondary | ICD-10-CM | POA: Insufficient documentation

## 2023-04-20 ENCOUNTER — Ambulatory Visit
Admission: RE | Admit: 2023-04-20 | Discharge: 2023-04-20 | Disposition: A | Discharge status: Home / Self Care | Payer: Medicare Other | Source: Ambulatory Visit | Attending: Physician Assistant | Admitting: Physician Assistant

## 2023-04-20 ENCOUNTER — Other Ambulatory Visit: Payer: Self-pay | Admitting: Physician Assistant

## 2023-04-20 DIAGNOSIS — R42 Dizziness and giddiness: Secondary | ICD-10-CM

## 2023-04-20 DIAGNOSIS — R112 Nausea with vomiting, unspecified: Secondary | ICD-10-CM

## 2023-04-20 DIAGNOSIS — R519 Headache, unspecified: Secondary | ICD-10-CM

## 2023-09-20 ENCOUNTER — Other Ambulatory Visit: Payer: Self-pay | Admitting: Internal Medicine

## 2023-09-20 DIAGNOSIS — Z1231 Encounter for screening mammogram for malignant neoplasm of breast: Secondary | ICD-10-CM

## 2023-10-28 ENCOUNTER — Ambulatory Visit
Admission: RE | Admit: 2023-10-28 | Discharge: 2023-10-28 | Disposition: A | Discharge status: Home / Self Care | Payer: Medicare Other | Source: Ambulatory Visit | Attending: Internal Medicine | Admitting: Internal Medicine

## 2023-10-28 DIAGNOSIS — Z1231 Encounter for screening mammogram for malignant neoplasm of breast: Secondary | ICD-10-CM | POA: Diagnosis present

## 2024-03-26 ENCOUNTER — Ambulatory Visit: Admitting: Urology

## 2024-04-17 ENCOUNTER — Ambulatory Visit: Admitting: Urology

## 2024-04-17 VITALS — BP 152/89 | HR 66 | Ht 63.5 in | Wt 145.2 lb

## 2024-04-17 DIAGNOSIS — N3281 Overactive bladder: Secondary | ICD-10-CM | POA: Diagnosis not present

## 2024-04-17 LAB — MICROSCOPIC EXAMINATION: Bacteria, UA: NONE SEEN

## 2024-04-17 LAB — URINALYSIS, COMPLETE
Bilirubin, UA: NEGATIVE
Glucose, UA: NEGATIVE
Ketones, UA: NEGATIVE
Leukocytes,UA: NEGATIVE
Nitrite, UA: NEGATIVE
Protein,UA: NEGATIVE
Specific Gravity, UA: <1.005 — ABNORMAL LOW (ref 1.005–1.030)
Urobilinogen, Ur: 0.2 mg/dL (ref 0.2–1.0)
pH, UA: 6 (ref 5.0–7.5)

## 2024-04-17 LAB — BLADDER SCAN AMB NON-IMAGING: Scan Result: 63

## 2024-04-17 NOTE — Patient Instructions (Signed)

## 2024-04-17 NOTE — Progress Notes (Signed)
 I,Amy L Pierron,acting as a scribe for Dustin Gimenez, MD.,have documented all relevant documentation on the behalf of Dustin Gimenez, MD,as directed by  Dustin Gimenez, MD while in the presence of Dustin Gimenez, MD.  04/17/2024 9:28 PM   Zackary Heron Pelley 25-Dec-1945 [id number]  Referring provider: Yehuda Helms, MD [address] [facility] [place],  [place] [address]  Chief Complaint  Patient presents with   Over Active Bladder    HPI: 78 year-old female presenting for further evaluation of urinary urgency and frequency.   She is currently on Vesicare for this issue but reports increased urination during the day and night with strong urgency. She experiences incontinence when laughing, coughing, and sneezing, and has one to two accidents daily. She uses four to six absorbent pads per day and a daily diaper. She sometimes limits fluid intake and engages in toilet mapping.   She has a remote history of smoking.   She was seen in 2017 for the same issue and was started on Myrbetriq , which was effective at that time. She has previously tried and failed Ditropan 15mg  XL due to dry mouth.   She has a history of an incidental kidney lesion and is status post abdominal hysterectomy in 2001 for abnormal uterine bleeding.   She had some improvement with Myrbetriq  but her insurance didn't cover it. She started Vesicare a few weeks ago and is seeing improvement. However, she has constipation and uses Miralax for relief.   She has experienced significant personal stressors, including the death of her granddaughter and sister, and her husband's hospitalization, which may contribute to her anxiety and urinary symptoms.  Results for orders placed or performed in visit on 04/17/24  Microscopic Examination   Urine  Result Value Ref Range   WBC, UA 0-5 0 - 5 /hpf   RBC, Urine 0-2 0 - 2 /hpf   Epithelial Cells (non renal) 0-10 0 - 10 /hpf   Bacteria, UA None seen None seen/Few   Urinalysis, Complete  Result Value Ref Range   Specific Gravity, UA <1.005 (L) 1.005 - 1.030   pH, UA 6.0 5.0 - 7.5   Color, UA Yellow Yellow   Appearance Ur Clear Clear   Leukocytes,UA Negative Negative   Protein,UA Negative Negative/Trace   Glucose, UA Negative Negative   Ketones, UA Negative Negative   RBC, UA Trace (A) Negative   Bilirubin, UA Negative Negative   Urobilinogen, Ur 0.2 0.2 - 1.0 mg/dL   Nitrite, UA Negative Negative   Microscopic Examination See below:   Bladder Scan (Post Void Residual) in office  Result Value Ref Range   Scan Result 63 ml      PMH: Past Medical History:  Diagnosis Date   Absolute anemia 04/10/2014   Allergy to environmental factors 04/10/2014   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   Blood pressure elevated without history of HTN 04/10/2014   Depression    Diverticulosis    Hepatitis    history of hepatitis A   History of chicken pox    HLD (hyperlipidemia) 04/10/2014   Redundant colon     Surgical History: Past Surgical History:  Procedure Laterality Date   ABDOMINAL HYSTERECTOMY  2001   Vesicovaginal fistula closure w/TAH   CARPAL TUNNEL RELEASE Right 2009   COLONOSCOPY WITH PROPOFOL  N/A 07/08/2016   Procedure: COLONOSCOPY WITH PROPOFOL ;  Surgeon: Deveron Fly, MD;  Location: [facility] ENDOSCOPY;  Service: Endoscopy;  Laterality: N/A;   COLONOSCOPY WITH PROPOFOL  N/A 07/05/2022  Procedure: COLONOSCOPY WITH PROPOFOL ;  Surgeon: Shane Darling, MD;  Location: [facility] ENDOSCOPY;  Service: Endoscopy;  Laterality: N/A;   DILATION AND CURETTAGE OF UTERUS     ESOPHAGOGASTRODUODENOSCOPY (EGD) WITH PROPOFOL  N/A 07/08/2016   Procedure: ESOPHAGOGASTRODUODENOSCOPY (EGD) WITH PROPOFOL ;  Surgeon: Deveron Fly, MD;  Location: [facility] ENDOSCOPY;  Service: Endoscopy;  Laterality: N/A;   ESOPHAGOGASTRODUODENOSCOPY (EGD) WITH PROPOFOL  N/A 08/11/2018   Procedure: ESOPHAGOGASTRODUODENOSCOPY (EGD) WITH PROPOFOL ;  Surgeon: Deveron Fly, MD;  Location: [facility]  ENDOSCOPY;  Service: Endoscopy;  Laterality: N/A;   ESOPHAGOGASTRODUODENOSCOPY (EGD) WITH PROPOFOL  N/A 07/05/2022   Procedure: ESOPHAGOGASTRODUODENOSCOPY (EGD) WITH PROPOFOL ;  Surgeon: Shane Darling, MD;  Location: [facility] ENDOSCOPY;  Service: Endoscopy;  Laterality: N/A;   FOOT SURGERY  2006   TONSILLECTOMY     VESICOVAGINAL FISTULA CLOSURE W/ TAH      Home Medications:  Allergies as of 04/17/2024   No Known Allergies      Medication List        Accurate as of April 17, 2024  9:28 PM. If you have any questions, ask your nurse or doctor.          STOP taking these medications    baclofen 10 MG tablet Commonly known as: LIORESAL   citalopram 20 MG tablet Commonly known as: CELEXA   conjugated estrogens  0.625 MG/GM vaginal cream Commonly known as: Premarin    famotidine 40 MG tablet Commonly known as: PEPCID   Myrbetriq  50 MG Tb24 tablet Generic drug: mirabegron  ER   PARoxetine 20 MG tablet Commonly known as: PAXIL   phentermine 37.5 MG tablet Commonly known as: ADIPEX-P   polyethylene glycol powder 17 GM/SCOOP powder Commonly known as: GLYCOLAX/MIRALAX   venlafaxine XR 75 MG 24 hr capsule Commonly known as: EFFEXOR-XR       TAKE these medications    ARIPiprazole 2 MG tablet Commonly known as: ABILIFY Take 2 mg by mouth daily.   atorvastatin 40 MG tablet Commonly known as: LIPITOR TAKE 1 TABLET (40 MG TOTAL) BY MOUTH ONCE DAILY.   FLUoxetine 20 MG capsule Commonly known as: PROZAC Take 1 tablet by mouth daily.   pantoprazole 40 MG tablet Commonly known as: PROTONIX TAKE 1 TABLET (40 MG TOTAL) BY MOUTH 2 (TWO) TIMES DAILY.   propranolol 20 MG tablet Commonly known as: INDERAL Take 20 mg by mouth 2 (two) times daily.   solifenacin 10 MG tablet Commonly known as: VESICARE Take 1 tablet by mouth daily.        Family History: Family History  Problem Relation Age of Onset   Breast cancer Sister 84   Bladder Cancer Neg Hx    Prostate  cancer Neg Hx    Kidney cancer Neg Hx     Social History:  reports that she has quit smoking. She has never used smokeless tobacco. She reports current alcohol use of about 1.0 standard drink of alcohol per week. She reports that she does not use drugs.   Physical Exam: BP (!) 152/89   Pulse 66   Ht 5' 3.5" (1.613 m)   Wt 145 lb 4 oz (65.9 kg)   BMI 25.33 kg/m   Constitutional:  Alert and oriented, No acute distress. HEENT: [place] AT, moist mucus membranes.  Trachea midline, no masses. Neurologic: Grossly intact, no focal deficits, moving all 4 extremities. Psychiatric: Normal mood and affect.  Urinalysis    Component Value Date/Time   APPEARANCEUR Clear 04/17/2024 1401   GLUCOSEU Negative 04/17/2024 1401   BILIRUBINUR Negative 04/17/2024  1401   PROTEINUR Negative 04/17/2024 1401   NITRITE Negative 04/17/2024 1401   LEUKOCYTESUR Negative 04/17/2024 1401    Lab Results  Component Value Date   LABMICR See below: 04/17/2024   WBCUA 0-5 04/17/2024   RBCUA 0-2 04/19/2016   LABEPIT 0-10 04/17/2024   BACTERIA None seen 04/17/2024    Assessment & Plan:    1. Urinary Urgency and Frequency/ OAB - Currently on Vesicare, which has been used for 2-3 weeks. She reports constipation, which is being managed with Miralax. - Consideration for a tier exception with [employer] to potentially switch back to Myrbetriq  or Gemtesa, which do not have anticholinergic side effects. - Discussed third-line treatment options, including Botox injections into the bladder and PTNS (Posterior Tibial Nerve Stimulation), and InterStim. Provided information on these options for her to review and discuss with her husband. - Advised to consider lifestyle modifications, such as reducing caffeine intake, which may exacerbate symptoms.  - She will reach out via Mychart when she decides which option to pursue.  Return if symptoms worsen or fail to improve.  I have reviewed the above documentation for  accuracy and completeness, and I agree with the above.   Dustin Gimenez, MD  [facility] [address], [address] [place], [place] [address] [phone]

## 2024-04-25 ENCOUNTER — Encounter: Payer: Self-pay | Admitting: Urology

## 2024-04-25 DIAGNOSIS — N3281 Overactive bladder: Secondary | ICD-10-CM

## 2024-07-12 ENCOUNTER — Other Ambulatory Visit

## 2024-07-12 DIAGNOSIS — N3281 Overactive bladder: Secondary | ICD-10-CM

## 2024-07-12 LAB — MICROSCOPIC EXAMINATION: RBC, Urine: NONE SEEN /HPF (ref 0–2)

## 2024-07-12 LAB — URINALYSIS, COMPLETE
Bilirubin, UA: NEGATIVE
Glucose, UA: NEGATIVE
Ketones, UA: NEGATIVE
Nitrite, UA: NEGATIVE
Protein,UA: NEGATIVE
RBC, UA: NEGATIVE
Specific Gravity, UA: <1.005 — ABNORMAL LOW (ref 1.005–1.030)
Urobilinogen, Ur: 0.2 mg/dL (ref 0.2–1.0)
pH, UA: 6 (ref 5.0–7.5)

## 2024-07-17 LAB — CULTURE, URINE COMPREHENSIVE

## 2024-07-19 ENCOUNTER — Telehealth: Payer: Self-pay

## 2024-07-19 NOTE — Telephone Encounter (Signed)
 Auth Submission: APPROVED Site of care: Urology Payer: [facility] medicare Medication & CPT/J Code(s) submitted: Botox G9414 Diagnosis Code:  Route of submission (phone, fax, portal): portal Phone # Fax # Auth type: Buy/Bill PB Units/visits requested: 100units  Reference number: [id number] Approval from: 07/19/24 to 07/19/25   Approval letter is in the media tab.

## 2024-07-26 ENCOUNTER — Other Ambulatory Visit: Payer: Self-pay

## 2024-07-26 MED ORDER — CIPROFLOXACIN HCL 250 MG PO TABS: ORAL_TABLET | ORAL | 0 refills | Status: AC

## 2024-07-26 NOTE — Progress Notes (Signed)
 LVM for pt, informing her of medication being sent.

## 2024-07-30 ENCOUNTER — Ambulatory Visit: Admitting: Urology

## 2024-07-30 DIAGNOSIS — N3946 Mixed incontinence: Secondary | ICD-10-CM | POA: Diagnosis not present

## 2024-07-30 MED ORDER — ONABOTULINUMTOXINA 100 UNITS IJ SOLR
100.0000 [IU] | Freq: Once | INTRAMUSCULAR | Status: AC
  Administered 2024-07-30: 100 [IU] via INTRAMUSCULAR

## 2024-07-30 NOTE — Progress Notes (Signed)
 Bladder Instillation  Due to botox  patient is present today for a Bladder Instillation of 2% lidocaine . Patient was cleaned and prepped in a sterile fashion with betadine and lidocaine  2% jelly was instilled into the urethra.  A 14 FR catheter was inserted, urine return was noted , urine was yellow, clear in color.  60 ml was instilled into the bladder. The catheter was then removed. Patient tolerated well, no complications were noted. Patient held in bladder for 30 minutes prior to procedure starting.   Performed by: Lenda Baratta Magallon-Mariche, RMA

## 2024-07-30 NOTE — Addendum Note (Signed)
 Addended byBETHA CORIE PLATER on: 07/30/2024 04:28 PM   Modules accepted: Orders

## 2024-07-30 NOTE — Progress Notes (Signed)
 07/30/2024 2:54 PM   Brittany Pittman [birth year] [id number]  Referring provider: Auston Reyes BIRCH, MD 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  Failed Ditropan.  Has been on Myrbetriq .  Has had a hysterectomy.  Incidental kidney lesion identified years ago.  Constipation managed with MiraLAX.  Third line therapies discussed.  Last seen April 17, 2024.  It appears in 2017 Myrbetriq  worked well.  Today I was surprised to learn that the patient was here for Botox .  I took a full history and the patient predominately has urge incontinence.  No longer has bedwetting.  Leaks sometimes with coughing sneezing bending lifting.  Can wear a number of pads a day that can be quite wet.  Voids approximately every hour.  Does have some nocturia.  Flow was good.  She does feel empty  No neurologic issues.  Has had a previous hysterectomy.  I discussed the pathophysiology of incontinence with her in detail and my usual workup with urodynamics when the patient has failed medications.  She understands that the test helps predict outcome and risk of retention.  We both talked about this in detail and I thought it was safe to proceed with the Botox  including cystoscopy and pelvic examination and stress test.  The role of another provider ordering something for another physician to do was discussed.  On physical examination patient had grade 2 hypermobility the bladder neck and a significant positive cough test.  Cystoscopy: Patient underwent flexible cystoscopy.  Bladder Koza and trigone were normal.  No cystitis.  No carcinoma.  She is on antibiotics as per protocol.  I injected 100 units of Botox  more with the midline template but did use a lot of the bladder.  She tolerated very well.  I have gone over risks of Botox  prior and she had read about  it.    PMH: Past Medical History:  Diagnosis Date   Absolute anemia 04/10/2014   Allergy to environmental factors 04/10/2014   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   Blood pressure elevated without history of HTN 04/10/2014   Depression    Diverticulosis    Hepatitis    history of hepatitis A   History of chicken pox    HLD (hyperlipidemia) 04/10/2014   Redundant colon     Surgical History: Past Surgical History:  Procedure Laterality Date   ABDOMINAL HYSTERECTOMY  2001   Vesicovaginal fistula closure w/TAH   CARPAL TUNNEL RELEASE Right 2009   COLONOSCOPY WITH PROPOFOL  N/A 07/08/2016   Procedure: COLONOSCOPY WITH PROPOFOL ;  Surgeon: Gladis RAYMOND Mariner, MD;  Location: [facility] ENDOSCOPY;  Service: Endoscopy;  Laterality: N/A;   COLONOSCOPY WITH PROPOFOL  N/A 07/05/2022   Procedure: COLONOSCOPY WITH PROPOFOL ;  Surgeon: Maryruth Ole DASEN, MD;  Location: [facility] ENDOSCOPY;  Service: Endoscopy;  Laterality: N/A;   DILATION AND CURETTAGE OF UTERUS     ESOPHAGOGASTRODUODENOSCOPY (EGD) WITH PROPOFOL  N/A 07/08/2016   Procedure: ESOPHAGOGASTRODUODENOSCOPY (EGD) WITH PROPOFOL ;  Surgeon: Gladis RAYMOND Mariner, MD;  Location: [facility] ENDOSCOPY;  Service: Endoscopy;  Laterality: N/A;   ESOPHAGOGASTRODUODENOSCOPY (EGD) WITH PROPOFOL  N/A 08/11/2018   Procedure: ESOPHAGOGASTRODUODENOSCOPY (EGD) WITH PROPOFOL ;  Surgeon: Mariner Gladis RAYMOND, MD;  Location: [facility] ENDOSCOPY;  Service: Endoscopy;  Laterality: N/A;   ESOPHAGOGASTRODUODENOSCOPY (EGD) WITH PROPOFOL  N/A 07/05/2022   Procedure: ESOPHAGOGASTRODUODENOSCOPY (EGD) WITH PROPOFOL ;  Surgeon: Maryruth Ole DASEN, MD;  Location:  [facility] ENDOSCOPY;  Service: Endoscopy;  Laterality: N/A;   FOOT SURGERY  2006   TONSILLECTOMY     VESICOVAGINAL FISTULA CLOSURE W/ TAH      Home Medications:  Allergies as of 07/30/2024   No Known Allergies      Medication List        Accurate as of July 30, 2024  2:54 PM. If you have any questions, ask your nurse or doctor.           ARIPiprazole 2 MG tablet Commonly known as: ABILIFY Take 2 mg by mouth daily.   atorvastatin 40 MG tablet Commonly known as: LIPITOR TAKE 1 TABLET (40 MG TOTAL) BY MOUTH ONCE DAILY.   ciprofloxacin  250 MG tablet Commonly known as: Cipro  Take 1 tablet (250 mg total) by mouth daily for 3 days. Take 1 tablet the day before Botox , 1 tablet the day of Botox  and 1 tablet the day after Botox    FLUoxetine 20 MG capsule Commonly known as: PROZAC Take 1 tablet by mouth daily.   pantoprazole 40 MG tablet Commonly known as: PROTONIX TAKE 1 TABLET (40 MG TOTAL) BY MOUTH 2 (TWO) TIMES DAILY.   propranolol 20 MG tablet Commonly known as: INDERAL Take 20 mg by mouth 2 (two) times daily.   solifenacin 10 MG tablet Commonly known as: VESICARE Take 1 tablet by mouth daily.        Allergies: No Known Allergies  Family History: Family History  Problem Relation Age of Onset   Breast cancer Sister 12   Bladder Cancer Neg Hx    Prostate cancer Neg Hx    Kidney cancer Neg Hx     Social History:  reports that she has quit smoking. She has never used smokeless tobacco. She reports current alcohol use of about 1.0 standard drink of alcohol per week. She reports that she does not use drugs.  ROS:                                        Physical Exam: There were no vitals taken for this visit.  Constitutional:  Alert and oriented, No acute distress. HEENT: [place] AT, moist mucus membranes.  Trachea midline, no masses.   Laboratory Data: No results found for: WBC, HGB, HCT, MCV, PLT  No results found for: CREATININE  No results found for: PSA  No results found for: TESTOSTERONE  No results found for: HGBA1C  Urinalysis    Component Value Date/Time   APPEARANCEUR Clear 07/12/2024 1120   GLUCOSEU Negative 07/12/2024 1120   BILIRUBINUR Negative 07/12/2024 1120   PROTEINUR Negative 07/12/2024 1120   NITRITE Negative 07/12/2024 1120    LEUKOCYTESUR Trace (A) 07/12/2024 1120    Pertinent Imaging:   Assessment & Plan: Follow-up as per protocol.  Hopefully she does well with her mixed incontinence from Botox  and she did understand the risk of retention  There are no diagnoses linked to this encounter.  No follow-ups on file.  Glendia DELENA Elizabeth, MD  [facility] [address] [place], [place] [address] [phone]

## 2024-08-15 ENCOUNTER — Ambulatory Visit: Admitting: Physician Assistant

## 2024-08-15 VITALS — BP 138/85 | HR 64 | Wt 143.0 lb

## 2024-08-15 DIAGNOSIS — N3281 Overactive bladder: Secondary | ICD-10-CM

## 2024-08-15 LAB — URINALYSIS, COMPLETE
Bilirubin, UA: NEGATIVE
Glucose, UA: NEGATIVE
Ketones, UA: NEGATIVE
Leukocytes,UA: NEGATIVE
Nitrite, UA: NEGATIVE
Protein,UA: NEGATIVE
Specific Gravity, UA: 1.025 (ref 1.005–1.030)
Urobilinogen, Ur: 0.2 mg/dL (ref 0.2–1.0)
pH, UA: 6 (ref 5.0–7.5)

## 2024-08-15 LAB — MICROSCOPIC EXAMINATION

## 2024-08-15 LAB — BLADDER SCAN AMB NON-IMAGING

## 2024-08-19 NOTE — Progress Notes (Signed)
 08/15/2024 2:28 PM   Donald MATSU Pollino 02/03/1946 [id number]  CC: Chief Complaint  Patient presents with   Follow-up   Botulinum Toxin Injection   HPI: Brittany Pittman is a 78 y.o. female with PMH OAB wet who underwent intravesical Botox  with Dr. Gaston on 07/30/2024 who presents today for follow-up.   Today she reports significant symptomatic improvement after Botox .  She started to notice a difference about 10 days after her injections.  She has now largely dry, and has only had a single episode of stress incontinence since her treatment.  She is off Vesicare and very pleased.  In-office UA today positive for trace intact blood; urine microscopy pan negative. PVR 0mL.  PMH: Past Medical History:  Diagnosis Date   Absolute anemia 04/10/2014   Allergy to environmental factors 04/10/2014   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   Blood pressure elevated without history of HTN 04/10/2014   Depression    Diverticulosis    Hepatitis    history of hepatitis A   History of chicken pox    HLD (hyperlipidemia) 04/10/2014   Redundant colon     Surgical History: Past Surgical History:  Procedure Laterality Date   ABDOMINAL HYSTERECTOMY  2001   Vesicovaginal fistula closure w/TAH   CARPAL TUNNEL RELEASE Right 2009   COLONOSCOPY WITH PROPOFOL  N/A 07/08/2016   Procedure: COLONOSCOPY WITH PROPOFOL ;  Surgeon: Gladis RAYMOND Mariner, MD;  Location: [facility] ENDOSCOPY;  Service: Endoscopy;  Laterality: N/A;   COLONOSCOPY WITH PROPOFOL  N/A 07/05/2022   Procedure: COLONOSCOPY WITH PROPOFOL ;  Surgeon: Maryruth Ole DASEN, MD;  Location: [facility] ENDOSCOPY;  Service: Endoscopy;  Laterality: N/A;   DILATION AND CURETTAGE OF UTERUS     ESOPHAGOGASTRODUODENOSCOPY (EGD) WITH PROPOFOL  N/A 07/08/2016   Procedure: ESOPHAGOGASTRODUODENOSCOPY (EGD) WITH PROPOFOL ;  Surgeon: Gladis RAYMOND Mariner, MD;  Location: [facility] ENDOSCOPY;  Service: Endoscopy;  Laterality: N/A;   ESOPHAGOGASTRODUODENOSCOPY (EGD) WITH PROPOFOL  N/A  08/11/2018   Procedure: ESOPHAGOGASTRODUODENOSCOPY (EGD) WITH PROPOFOL ;  Surgeon: Mariner Gladis RAYMOND, MD;  Location: [facility] ENDOSCOPY;  Service: Endoscopy;  Laterality: N/A;   ESOPHAGOGASTRODUODENOSCOPY (EGD) WITH PROPOFOL  N/A 07/05/2022   Procedure: ESOPHAGOGASTRODUODENOSCOPY (EGD) WITH PROPOFOL ;  Surgeon: Maryruth Ole DASEN, MD;  Location: [facility] ENDOSCOPY;  Service: Endoscopy;  Laterality: N/A;   FOOT SURGERY  2006   TONSILLECTOMY     VESICOVAGINAL FISTULA CLOSURE W/ TAH      Home Medications:  Allergies as of 08/15/2024   No Known Allergies      Medication List        Accurate as of August 15, 2024 11:59 PM. If you have any questions, ask your nurse or doctor.          ARIPiprazole 2 MG tablet Commonly known as: ABILIFY Take 2 mg by mouth daily.   atorvastatin 40 MG tablet Commonly known as: LIPITOR TAKE 1 TABLET (40 MG TOTAL) BY MOUTH ONCE DAILY.   ciprofloxacin  250 MG tablet Commonly known as: Cipro  Take 1 tablet (250 mg total) by mouth daily for 3 days. Take 1 tablet the day before Botox , 1 tablet the day of Botox  and 1 tablet the day after Botox    FLUoxetine 20 MG capsule Commonly known as: PROZAC Take 1 tablet by mouth daily.   pantoprazole 40 MG tablet Commonly known as: PROTONIX TAKE 1 TABLET (40 MG TOTAL) BY MOUTH 2 (TWO) TIMES DAILY.   propranolol 20 MG tablet Commonly known as: INDERAL Take 20 mg by mouth 2 (two) times daily.   solifenacin 10 MG tablet Commonly known  as: VESICARE Take 1 tablet by mouth daily.        Allergies:  No Known Allergies  Family History: Family History  Problem Relation Age of Onset   Breast cancer Sister 24   Bladder Cancer Neg Hx    Prostate cancer Neg Hx    Kidney cancer Neg Hx     Social History:   reports that she has quit smoking. She has never used smokeless tobacco. She reports current alcohol use of about 1.0 standard drink of alcohol per week. She reports that she does not use drugs.  Physical  Exam: BP 138/85 (BP Location: Left Arm, Patient Position: Sitting, Cuff Size: Normal)   Pulse 64   Wt 143 lb (64.9 kg)   SpO2 99%   BMI 24.93 kg/m   Constitutional:  Alert and oriented, no acute distress, nontoxic appearing HEENT: [place], AT Cardiovascular: No clubbing, cyanosis, or edema Respiratory: Normal respiratory effort, no increased work of breathing Skin: No rashes, bruises or suspicious lesions Neurologic: Grossly intact, no focal deficits, moving all 4 extremities Psychiatric: Normal mood and affect  Laboratory Data: Results for orders placed or performed in visit on 08/15/24  Microscopic Examination   Collection Time: 08/15/24  3:37 PM   Urine  Result Value Ref Range   WBC, UA 0-5 0 - 5 /hpf   RBC, Urine 0-2 0 - 2 /hpf   Epithelial Cells (non renal) 0-10 0 - 10 /hpf   Mucus, UA Present (A) Not Estab.   Bacteria, UA Few None seen/Few  Urinalysis, Complete   Collection Time: 08/15/24  3:37 PM  Result Value Ref Range   Specific Gravity, UA 1.025 1.005 - 1.030   pH, UA 6.0 5.0 - 7.5   Color, UA Yellow Yellow   Appearance Ur Clear Clear   Leukocytes,UA Negative Negative   Protein,UA Negative Negative/Trace   Glucose, UA Negative Negative   Ketones, UA Negative Negative   RBC, UA Trace (A) Negative   Bilirubin, UA Negative Negative   Urobilinogen, Ur 0.2 0.2 - 1.0 mg/dL   Nitrite, UA Negative Negative   Microscopic Examination See below:   Bladder Scan (Post Void Residual) in office   Collection Time: 08/15/24  3:51 PM  Result Value Ref Range   Scan Result 0mL    Assessment & Plan:   1. Overactive bladder (Primary) She is doing exceedingly well since intravesical Botox .  UA bland and she is emptying appropriately.  She is largely dry.  She prefers to call us  when she is ready for her next injection, which is reasonable.  I did advise her about normal wait times to get into clinic, also advised her to reach out a little bit early to give us  additional time to get her  scheduled. - Bladder Scan (Post Void Residual) in office - Urinalysis, Complete   Return for Patient to call to schedule next Botox  when ready.  Lucie Hones, PA-C  [facility] [place] [address] [place], [place] [address] [phone]

## 2024-10-23 ENCOUNTER — Other Ambulatory Visit: Payer: Self-pay | Admitting: Internal Medicine

## 2024-10-23 DIAGNOSIS — Z1231 Encounter for screening mammogram for malignant neoplasm of breast: Secondary | ICD-10-CM

## 2024-11-14 ENCOUNTER — Inpatient Hospital Stay: Admission: RE | Admit: 2024-11-14 | Discharge: 2024-11-14 | Attending: Internal Medicine | Admitting: Internal Medicine

## 2024-11-14 DIAGNOSIS — Z1231 Encounter for screening mammogram for malignant neoplasm of breast: Secondary | ICD-10-CM | POA: Diagnosis present
